# Patient Record
Sex: Male | Born: 1960 | Race: White | Hispanic: No | Marital: Married | Smoking: Former smoker
Health system: Southern US, Community
[De-identification: ages and names within clinical notes are randomized; demographics above are authoritative.]

## PROBLEM LIST (undated history)

## (undated) DIAGNOSIS — J189 Pneumonia, unspecified organism: Secondary | ICD-10-CM

## (undated) DIAGNOSIS — T7840XA Allergy, unspecified, initial encounter: Secondary | ICD-10-CM

## (undated) DIAGNOSIS — I10 Essential (primary) hypertension: Secondary | ICD-10-CM

## (undated) HISTORY — DX: Allergy, unspecified, initial encounter: T78.40XA

## (undated) HISTORY — DX: Essential (primary) hypertension: I10

## (undated) HISTORY — PX: OTHER SURGICAL HISTORY: SHX169

## (undated) HISTORY — DX: Pneumonia, unspecified organism: J18.9

---

## 1997-10-13 ENCOUNTER — Ambulatory Visit (HOSPITAL_COMMUNITY): Admission: RE | Admit: 1997-10-13 | Discharge: 1997-10-13 | Payer: Self-pay | Admitting: Allergy and Immunology

## 1999-06-01 ENCOUNTER — Encounter: Payer: Self-pay | Admitting: Emergency Medicine

## 1999-06-01 ENCOUNTER — Emergency Department (HOSPITAL_COMMUNITY): Admission: EM | Admit: 1999-06-01 | Discharge: 1999-06-01 | Payer: Self-pay | Admitting: Emergency Medicine

## 2003-12-12 ENCOUNTER — Emergency Department (HOSPITAL_COMMUNITY): Admission: EM | Admit: 2003-12-12 | Discharge: 2003-12-12 | Payer: Self-pay | Admitting: Emergency Medicine

## 2004-05-28 ENCOUNTER — Ambulatory Visit: Payer: Self-pay | Admitting: Family Medicine

## 2004-06-19 ENCOUNTER — Ambulatory Visit: Payer: Self-pay | Admitting: Family Medicine

## 2004-07-11 ENCOUNTER — Ambulatory Visit: Payer: Self-pay | Admitting: Family Medicine

## 2005-12-13 ENCOUNTER — Ambulatory Visit: Payer: Self-pay | Admitting: Family Medicine

## 2005-12-23 ENCOUNTER — Ambulatory Visit: Payer: Self-pay | Admitting: Family Medicine

## 2006-01-06 ENCOUNTER — Ambulatory Visit: Payer: Self-pay | Admitting: Family Medicine

## 2006-07-07 ENCOUNTER — Ambulatory Visit: Payer: Self-pay | Admitting: Family Medicine

## 2006-12-11 DIAGNOSIS — J45909 Unspecified asthma, uncomplicated: Secondary | ICD-10-CM | POA: Insufficient documentation

## 2006-12-11 DIAGNOSIS — I10 Essential (primary) hypertension: Secondary | ICD-10-CM | POA: Insufficient documentation

## 2007-01-16 ENCOUNTER — Ambulatory Visit: Payer: Self-pay | Admitting: Family Medicine

## 2007-01-16 DIAGNOSIS — J309 Allergic rhinitis, unspecified: Secondary | ICD-10-CM | POA: Insufficient documentation

## 2007-05-14 ENCOUNTER — Emergency Department (HOSPITAL_COMMUNITY): Admission: EM | Admit: 2007-05-14 | Discharge: 2007-05-14 | Payer: Self-pay | Admitting: Emergency Medicine

## 2007-06-09 ENCOUNTER — Ambulatory Visit: Payer: Self-pay | Admitting: Family Medicine

## 2007-06-09 DIAGNOSIS — J209 Acute bronchitis, unspecified: Secondary | ICD-10-CM | POA: Insufficient documentation

## 2007-09-18 ENCOUNTER — Emergency Department (HOSPITAL_BASED_OUTPATIENT_CLINIC_OR_DEPARTMENT_OTHER): Admission: EM | Admit: 2007-09-18 | Discharge: 2007-09-19 | Payer: Self-pay | Admitting: Emergency Medicine

## 2007-12-03 ENCOUNTER — Ambulatory Visit: Payer: Self-pay | Admitting: Family Medicine

## 2007-12-05 LAB — CONVERTED CEMR LAB
ALT: 28 units/L (ref 0–53)
AST: 23 units/L (ref 0–37)
Basophils Absolute: 0 10*3/uL (ref 0.0–0.1)
Basophils Relative: 0.3 % (ref 0.0–3.0)
CO2: 26 meq/L (ref 19–32)
Calcium: 8.9 mg/dL (ref 8.4–10.5)
Chloride: 106 meq/L (ref 96–112)
Creatinine, Ser: 1 mg/dL (ref 0.4–1.5)
Direct LDL: 108.3 mg/dL
Eosinophils Relative: 3.8 % (ref 0.0–5.0)
Glucose, Bld: 72 mg/dL (ref 70–99)
HDL: 30.2 mg/dL — ABNORMAL LOW (ref >39.0)
Hemoglobin: 13.7 g/dL (ref 13.0–17.0)
Lymphocytes Relative: 17.9 % (ref 12.0–46.0)
Monocytes Relative: 7.3 % (ref 3.0–12.0)
Neutro Abs: 5.9 10*3/uL (ref 1.4–7.7)
Neutrophils Relative %: 70.7 % (ref 43.0–77.0)
RBC: 4.4 M/uL (ref 4.22–5.81)
TSH: 1.07 microintl units/mL (ref 0.35–5.50)
Total Bilirubin: 0.7 mg/dL (ref 0.3–1.2)
Total CHOL/HDL Ratio: 6.8
Total Protein: 7 g/dL (ref 6.0–8.3)
VLDL: 24 mg/dL (ref 0–40)
WBC: 8.3 10*3/uL (ref 4.5–10.5)

## 2008-02-19 ENCOUNTER — Ambulatory Visit: Payer: Self-pay | Admitting: Family Medicine

## 2008-04-05 ENCOUNTER — Encounter: Payer: Self-pay | Admitting: Family Medicine

## 2008-04-06 ENCOUNTER — Encounter: Payer: Self-pay | Admitting: Family Medicine

## 2008-04-28 ENCOUNTER — Telehealth: Payer: Self-pay | Admitting: Family Medicine

## 2008-05-30 ENCOUNTER — Ambulatory Visit: Payer: Self-pay | Admitting: Family Medicine

## 2008-07-01 ENCOUNTER — Encounter: Payer: Self-pay | Admitting: Emergency Medicine

## 2008-07-01 ENCOUNTER — Ambulatory Visit: Payer: Self-pay | Admitting: Critical Care Medicine

## 2008-07-01 ENCOUNTER — Ambulatory Visit: Payer: Self-pay | Admitting: Diagnostic Radiology

## 2008-07-01 ENCOUNTER — Inpatient Hospital Stay (HOSPITAL_COMMUNITY): Admission: EM | Admit: 2008-07-01 | Discharge: 2008-07-07 | Payer: Self-pay | Admitting: Internal Medicine

## 2008-07-01 ENCOUNTER — Ambulatory Visit: Payer: Self-pay | Admitting: Internal Medicine

## 2008-07-11 ENCOUNTER — Ambulatory Visit: Payer: Self-pay | Admitting: Family Medicine

## 2008-07-11 DIAGNOSIS — J189 Pneumonia, unspecified organism: Secondary | ICD-10-CM | POA: Insufficient documentation

## 2009-01-30 ENCOUNTER — Ambulatory Visit: Payer: Self-pay | Admitting: Family Medicine

## 2009-01-31 LAB — CONVERTED CEMR LAB
ALT: 26 units/L (ref 0–53)
AST: 26 units/L (ref 0–37)
Albumin: 3.8 g/dL (ref 3.5–5.2)
Alkaline Phosphatase: 64 units/L (ref 39–117)
Basophils Relative: 1.2 % (ref 0.0–3.0)
CO2: 28 meq/L (ref 19–32)
Calcium: 8.6 mg/dL (ref 8.4–10.5)
GFR calc non Af Amer: 75.94 mL/min (ref >60)
HCT: 42 % (ref 39.0–52.0)
HDL: 32.6 mg/dL — ABNORMAL LOW (ref >39.00)
Hemoglobin: 13.9 g/dL (ref 13.0–17.0)
Lymphocytes Relative: 20.1 % (ref 12.0–46.0)
Lymphs Abs: 1.3 10*3/uL (ref 0.7–4.0)
Monocytes Relative: 9 % (ref 3.0–12.0)
Neutro Abs: 4.1 10*3/uL (ref 1.4–7.7)
Potassium: 4.4 meq/L (ref 3.5–5.1)
RBC: 4.59 M/uL (ref 4.22–5.81)
Sodium: 143 meq/L (ref 135–145)
TSH: 1.79 microintl units/mL (ref 0.35–5.50)
Total CHOL/HDL Ratio: 5
Total Protein: 6.7 g/dL (ref 6.0–8.3)
VLDL: 30.2 mg/dL (ref 0.0–40.0)

## 2009-05-03 ENCOUNTER — Ambulatory Visit: Payer: Self-pay | Admitting: Family Medicine

## 2009-06-01 ENCOUNTER — Ambulatory Visit: Payer: Self-pay | Admitting: Family Medicine

## 2009-06-30 ENCOUNTER — Ambulatory Visit: Payer: Self-pay | Admitting: Family Medicine

## 2009-08-31 ENCOUNTER — Ambulatory Visit: Payer: Self-pay | Admitting: Cardiology

## 2009-08-31 ENCOUNTER — Ambulatory Visit: Payer: Self-pay | Admitting: Diagnostic Radiology

## 2009-08-31 ENCOUNTER — Observation Stay (HOSPITAL_COMMUNITY): Admission: EM | Admit: 2009-08-31 | Discharge: 2009-09-01 | Payer: Self-pay | Admitting: Cardiology

## 2009-08-31 ENCOUNTER — Encounter: Payer: Self-pay | Admitting: Emergency Medicine

## 2009-09-04 ENCOUNTER — Telehealth (INDEPENDENT_AMBULATORY_CARE_PROVIDER_SITE_OTHER): Payer: Self-pay | Admitting: *Deleted

## 2009-09-27 ENCOUNTER — Encounter: Payer: Self-pay | Admitting: Cardiology

## 2009-09-27 DIAGNOSIS — R079 Chest pain, unspecified: Secondary | ICD-10-CM | POA: Insufficient documentation

## 2009-09-28 ENCOUNTER — Encounter: Payer: Self-pay | Admitting: Internal Medicine

## 2009-09-28 ENCOUNTER — Ambulatory Visit: Payer: Self-pay | Admitting: Cardiology

## 2009-09-28 LAB — CONVERTED CEMR LAB
BUN: 18 mg/dL (ref 6–23)
Calcium: 9.5 mg/dL (ref 8.4–10.5)
Chloride: 109 meq/L (ref 96–112)
Creatinine, Ser: 1.2 mg/dL (ref 0.4–1.5)
Eosinophils Relative: 5 % (ref 0.0–5.0)
GFR calc non Af Amer: 69.83 mL/min (ref >60)
INR: 1.2 — ABNORMAL HIGH (ref 0.8–1.0)
Lymphocytes Relative: 15.8 % (ref 12.0–46.0)
MCV: 92.5 fL (ref 78.0–100.0)
Monocytes Absolute: 0.6 10*3/uL (ref 0.1–1.0)
Neutrophils Relative %: 73.2 % (ref 43.0–77.0)
Platelets: 365 10*3/uL (ref 150.0–400.0)
Prothrombin Time: 13.1 s — ABNORMAL HIGH (ref 9.7–11.8)
WBC: 9.9 10*3/uL (ref 4.5–10.5)

## 2009-10-06 ENCOUNTER — Inpatient Hospital Stay (HOSPITAL_BASED_OUTPATIENT_CLINIC_OR_DEPARTMENT_OTHER): Admission: RE | Admit: 2009-10-06 | Discharge: 2009-10-06 | Payer: Self-pay | Admitting: Internal Medicine

## 2009-10-06 ENCOUNTER — Ambulatory Visit: Payer: Self-pay | Admitting: Internal Medicine

## 2009-10-09 ENCOUNTER — Telehealth: Payer: Self-pay | Admitting: Family Medicine

## 2009-10-26 ENCOUNTER — Ambulatory Visit: Payer: Self-pay | Admitting: Cardiology

## 2009-10-26 ENCOUNTER — Ambulatory Visit: Payer: Self-pay | Admitting: Family Medicine

## 2009-10-26 DIAGNOSIS — K219 Gastro-esophageal reflux disease without esophagitis: Secondary | ICD-10-CM | POA: Insufficient documentation

## 2009-10-26 DIAGNOSIS — I2510 Atherosclerotic heart disease of native coronary artery without angina pectoris: Secondary | ICD-10-CM | POA: Insufficient documentation

## 2010-05-10 ENCOUNTER — Encounter: Payer: Self-pay | Admitting: Family Medicine

## 2010-05-15 ENCOUNTER — Other Ambulatory Visit: Payer: Self-pay | Admitting: Family Medicine

## 2010-05-15 ENCOUNTER — Ambulatory Visit
Admission: RE | Admit: 2010-05-15 | Discharge: 2010-05-15 | Payer: Self-pay | Source: Home / Self Care | Attending: Cardiology | Admitting: Cardiology

## 2010-05-15 ENCOUNTER — Ambulatory Visit
Admission: RE | Admit: 2010-05-15 | Discharge: 2010-05-15 | Payer: Self-pay | Source: Home / Self Care | Attending: Family Medicine | Admitting: Family Medicine

## 2010-05-15 LAB — TSH: TSH: 2.62 u[IU]/mL (ref 0.35–5.50)

## 2010-05-15 LAB — BASIC METABOLIC PANEL
CO2: 28 mEq/L (ref 19–32)
Calcium: 9.1 mg/dL (ref 8.4–10.5)
Glucose, Bld: 85 mg/dL (ref 70–99)
Potassium: 4.9 mEq/L (ref 3.5–5.1)
Sodium: 143 mEq/L (ref 135–145)

## 2010-05-15 LAB — CONVERTED CEMR LAB
Bilirubin Urine: NEGATIVE
Ketones, urine, test strip: NEGATIVE
Specific Gravity, Urine: 1.015

## 2010-05-15 LAB — CBC WITH DIFFERENTIAL/PLATELET
Basophils Absolute: 0.1 10*3/uL (ref 0.0–0.1)
Eosinophils Absolute: 0.6 10*3/uL (ref 0.0–0.7)
HCT: 40.3 % (ref 39.0–52.0)
Hemoglobin: 13.7 g/dL (ref 13.0–17.0)
Lymphs Abs: 1.9 10*3/uL (ref 0.7–4.0)
MCHC: 34 g/dL (ref 30.0–36.0)
Neutro Abs: 4.1 10*3/uL (ref 1.4–7.7)
RDW: 13.4 % (ref 11.5–14.6)

## 2010-05-15 LAB — LIPID PANEL
Cholesterol: 126 mg/dL (ref 0–200)
HDL: 35.2 mg/dL — ABNORMAL LOW (ref >39.00)

## 2010-05-15 LAB — HEPATIC FUNCTION PANEL: Albumin: 3.7 g/dL (ref 3.5–5.2)

## 2010-05-15 NOTE — Assessment & Plan Note (Signed)
Summary: cough/head and chest congestion/cjr   Vital Signs:  Patient profile:   50 year old male Weight:      217 pounds Temp:     98.4 degrees F oral Pulse rate:   89 / minute BP sitting:   116 / 64  (left arm) Cuff size:   large  Vitals Entered By: Alfred Levins, CMA (May 03, 2009 4:31 PM) CC: head and chest congestion x1 mth off and on   History of Present Illness: Here for one week of stuffy head, PND, ST, chest congestion, and coughing up yellow sputum. No fever. On his usual inhalers.   Allergies: 1)  ! Penicillin V Potassium (Penicillin V Potassium) 2)  ! * Eggs  Past History:  Past Medical History: Reviewed history from 07/11/2008 and no changes required. Asthma, sees Dr. Corinda Gubler Hypertension Allergic rhinitis normal treadmill ECHO on 04-06-08 at [facility], admitted for chest pains pneumonia 3-10  Past Surgical History: Reviewed history from 01/16/2007 and no changes required. Denies surgical history  Review of Systems  The patient denies anorexia, fever, weight loss, weight gain, vision loss, decreased hearing, hoarseness, chest pain, syncope, dyspnea on exertion, peripheral edema, hemoptysis, abdominal pain, melena, hematochezia, severe indigestion/heartburn, hematuria, incontinence, genital sores, muscle weakness, suspicious skin lesions, transient blindness, difficulty walking, depression, unusual weight change, abnormal bleeding, enlarged lymph nodes, angioedema, breast masses, and testicular masses.    Physical Exam  General:  Well-developed,well-nourished,in no acute distress; alert,appropriate and cooperative throughout examination Head:  Normocephalic and atraumatic without obvious abnormalities. No apparent alopecia or balding. Eyes:  No corneal or conjunctival inflammation noted. EOMI. Perrla. Funduscopic exam benign, without hemorrhages, exudates or papilledema. Vision grossly normal. Ears:  External ear exam shows no significant lesions or  deformities.  Otoscopic examination reveals clear canals, tympanic membranes are intact bilaterally without bulging, retraction, inflammation or discharge. Hearing is grossly normal bilaterally. Nose:  External nasal examination shows no deformity or inflammation. Nasal mucosa are pink and moist without lesions or exudates. Mouth:  Oral mucosa and oropharynx without lesions or exudates.  Teeth in good repair. Neck:  No deformities, masses, or tenderness noted. Lungs:  scattered rhonchi and wheezes, no rales   Impression & Recommendations:  Problem # 1:  ACUTE BRONCHITIS (ICD-466.0)  His updated medication list for this problem includes:    Symbicort 160-4.5 Mcg/act Aero (Budesonide-formoterol fumarate) .Marland Kitchen... 1 puff two times a day    Proair Hfa 108 (90 Base) Mcg/act Aers (Albuterol sulfate) .Marland Kitchen... As needed    Singulair 10 Mg Tabs (Montelukast sodium) .Marland Kitchen... 1 by mouth once daily    Spiriva Handihaler 18 Mcg Caps (Tiotropium bromide monohydrate) ..... Once daily    Levaquin 500 Mg Tabs (Levofloxacin) ..... Once daily  Complete Medication List: 1)  Symbicort 160-4.5 Mcg/act Aero (Budesonide-formoterol fumarate) .Marland Kitchen.. 1 puff two times a day 2)  Allegra 180 Mg Tabs (Fexofenadine hcl) .Marland Kitchen.. 1 by mouth once daily 3)  Nasonex 50 Mcg/act Susp (Mometasone furoate) .... 2 sprays each nostril once daily 4)  Proair Hfa 108 (90 Base) Mcg/act Aers (Albuterol sulfate) .... As needed 5)  Singulair 10 Mg Tabs (Montelukast sodium) .Marland Kitchen.. 1 by mouth once daily 6)  Furosemide 20 Mg Tabs (Furosemide) .... Once daily 7)  Astepro 137 Mcg/spray Soln (Azelastine hcl) .Marland Kitchen.. 1 spray each nostril once daily 8)  Spiriva Handihaler 18 Mcg Caps (Tiotropium bromide monohydrate) .... Once daily 9)  Amlodipine Besy-benazepril Hcl 10-20 Mg Caps (Amlodipine besy-benazepril hcl) .Marland Kitchen.. 1 by mouth once daily 10)  Levaquin  500 Mg Tabs (Levofloxacin) .... Once daily  Patient Instructions: 1)  Please schedule a follow-up appointment  as needed .  Prescriptions: LEVAQUIN 500 MG TABS (LEVOFLOXACIN) once daily  #10 x 0   Entered and Authorized by:   Nelwyn Salisbury MD   Signed by:   Nelwyn Salisbury MD on 05/03/2009   Method used:   Electronically to        [facility]  [phone]* (retail)       [address]       [place], [place]  [address]       Ph: [phone] or [phone]       Fax: [phone]   RxID:   [id number]

## 2010-05-15 NOTE — Assessment & Plan Note (Signed)
Summary: ? flu//ccm   Vital Signs:  Patient profile:   50 year old male Weight:      212 pounds Temp:     98.0 degrees F oral BP sitting:   114 / 80  (left arm) Cuff size:   regular  Vitals Entered By: Raechel Ache, RN (June 30, 2009 2:06 PM) CC: Sick since Wed with nose congestion, cough, body aches and headache.   History of Present Illness: Here for 3 days of aches, HA, PND, ST, chest congesiton, and coughing up yellow sputum. No fever.   Allergies: 1)  ! Penicillin V Potassium (Penicillin V Potassium) 2)  ! * Eggs  Past History:  Past Medical History: Reviewed history from 07/11/2008 and no changes required. Asthma, sees Dr. Corinda Gubler Hypertension Allergic rhinitis normal treadmill ECHO on 04-06-08 at [facility], admitted for chest pains pneumonia 3-10  Review of Systems  The patient denies anorexia, fever, weight loss, weight gain, vision loss, decreased hearing, hoarseness, chest pain, syncope, peripheral edema, headaches, hemoptysis, abdominal pain, melena, hematochezia, severe indigestion/heartburn, hematuria, incontinence, genital sores, muscle weakness, suspicious skin lesions, transient blindness, difficulty walking, depression, unusual weight change, abnormal bleeding, enlarged lymph nodes, angioedema, breast masses, and testicular masses.    Physical Exam  General:  Well-developed,well-nourished,in no acute distress; alert,appropriate and cooperative throughout examination Head:  Normocephalic and atraumatic without obvious abnormalities. No apparent alopecia or balding. Eyes:  No corneal or conjunctival inflammation noted. EOMI. Perrla. Funduscopic exam benign, without hemorrhages, exudates or papilledema. Vision grossly normal. Ears:  External ear exam shows no significant lesions or deformities.  Otoscopic examination reveals clear canals, tympanic membranes are intact bilaterally without bulging, retraction, inflammation or discharge. Hearing is  grossly normal bilaterally. Nose:  External nasal examination shows no deformity or inflammation. Nasal mucosa are pink and moist without lesions or exudates. Mouth:  Oral mucosa and oropharynx without lesions or exudates.  Teeth in good repair. Neck:  No deformities, masses, or tenderness noted. Lungs:  scattered rhonchi and wheezes   Impression & Recommendations:  Problem # 1:  ACUTE BRONCHITIS (ICD-466.0)  His updated medication list for this problem includes:    Symbicort 160-4.5 Mcg/act Aero (Budesonide-formoterol fumarate) .Marland Kitchen... 1 puff two times a day    Proair Hfa 108 (90 Base) Mcg/act Aers (Albuterol sulfate) .Marland Kitchen... As needed    Singulair 10 Mg Tabs (Montelukast sodium) .Marland Kitchen... 1 by mouth once daily    Spiriva Handihaler 18 Mcg Caps (Tiotropium bromide monohydrate) ..... Once daily    Levaquin 500 Mg Tabs (Levofloxacin) ..... Once daily  Problem # 2:  ASTHMA (ICD-493.90)  His updated medication list for this problem includes:    Symbicort 160-4.5 Mcg/act Aero (Budesonide-formoterol fumarate) .Marland Kitchen... 1 puff two times a day    Proair Hfa 108 (90 Base) Mcg/act Aers (Albuterol sulfate) .Marland Kitchen... As needed    Singulair 10 Mg Tabs (Montelukast sodium) .Marland Kitchen... 1 by mouth once daily    Spiriva Handihaler 18 Mcg Caps (Tiotropium bromide monohydrate) ..... Once daily  Complete Medication List: 1)  Symbicort 160-4.5 Mcg/act Aero (Budesonide-formoterol fumarate) .Marland Kitchen.. 1 puff two times a day 2)  Allegra 180 Mg Tabs (Fexofenadine hcl) .Marland Kitchen.. 1 by mouth once daily 3)  Nasonex 50 Mcg/act Susp (Mometasone furoate) .... 2 sprays each nostril once daily 4)  Proair Hfa 108 (90 Base) Mcg/act Aers (Albuterol sulfate) .... As needed 5)  Singulair 10 Mg Tabs (Montelukast sodium) .Marland Kitchen.. 1 by mouth once daily 6)  Furosemide 20 Mg Tabs (Furosemide) .... Once daily 7)  Astepro 137 Mcg/spray Soln (Azelastine hcl) .Marland Kitchen.. 1 spray each nostril once daily 8)  Spiriva Handihaler 18 Mcg Caps (Tiotropium bromide monohydrate)  .... Once daily 9)  Amlodipine Besy-benazepril Hcl 10-20 Mg Caps (Amlodipine besy-benazepril hcl) .Marland Kitchen.. 1 by mouth once daily 10)  Levaquin 500 Mg Tabs (Levofloxacin) .... Once daily  Patient Instructions: 1)  Please schedule a follow-up appointment as needed .  Prescriptions: LEVAQUIN 500 MG TABS (LEVOFLOXACIN) once daily  #10 x 0   Entered and Authorized by:   Nelwyn Salisbury MD   Signed by:   Nelwyn Salisbury MD on 06/30/2009   Method used:   Electronically to        [facility]  [phone]* (retail)       [address]       [place], [place]  [address]       Ph: [phone] or [phone]       Fax: [phone]   RxID:   [id number]

## 2010-05-15 NOTE — Progress Notes (Signed)
  Recieved papers from [facility] asking for Clearance on this Pt. I have sent paperwork to [facility] Nurse. Cala Bradford Mesiemore  Sep 04, 2009 2:59 PM

## 2010-05-15 NOTE — Miscellaneous (Signed)
  Clinical Lists Changes  Problems: Added new problem of CAD (ICD-414.00) Observations: Added new observation of PAST MED HX: Asthma, sees Dr. Corinda Gubler Hypertension Allergic rhinitis Chest pain    Stress ECHO  03/2008  [place] Kayser [place]  /  [facility]..08/31/2009....MI ruled out  /  recurrent chest pain office September 28, 2009 /  catheterization... October 06, 2009... mild to moderate nonobstructive disease.... current pain is not cardiac.... normal LV function.... plan aspirin and a statin CAD.... mild.... nonobstructive.... catheterization.... June, 2011 pneumonia 3-10 Family Hx CAD  (10/26/2009 12:29) Added new observation of REFERRING MD: Delano Metz, Allergy (10/26/2009 12:29) Added new observation of PRIMARY MD: Gershon Crane, MD (10/26/2009 12:29)       Past History:  Past Medical History: Asthma, sees Dr. Corinda Gubler Hypertension Allergic rhinitis Chest pain    Stress ECHO  03/2008  Roma Kayser [place]  /  [facility]..08/31/2009....MI ruled out  /  recurrent chest pain office September 28, 2009 /  catheterization... October 06, 2009... mild to moderate nonobstructive disease.... current pain is not cardiac.... normal LV function.... plan aspirin and a statin CAD.... mild.... nonobstructive.... catheterization.... June, 2011 pneumonia 3-10 Family Hx CAD

## 2010-05-15 NOTE — Miscellaneous (Signed)
  Clinical Lists Changes  Problems: Added new problem of CHEST PAIN (ICD-786.50) Observations: Added new observation of PRIMARY MD: Gershon Crane, MD (09/27/2009 8:24) Added new observation of REFERRING MD: Delano Metz, Allergy (09/27/2009 8:24) Added new observation of PAST MED HX: Asthma, sees Dr. Corinda Gubler Hypertension Allergic rhinitis Chest pain    Stress ECHO  03/2008  Roma Kayser [place]  /  [facility]..08/31/2009..probable musculoskeletal pneumonia 3-10 Family Hx CAD  (09/27/2009 8:24)       Past History:  Past Medical History: Asthma, sees Dr. Corinda Gubler Hypertension Allergic rhinitis Chest pain    Stress ECHO  03/2008  Roma Kayser [place]  /  [facility]..08/31/2009..probable musculoskeletal pneumonia 3-10 Family Hx CAD

## 2010-05-15 NOTE — Progress Notes (Signed)
Summary: cath & CP fu  Phone Note Call from Patient   Summary of Call: FU appt made Dr. Clent Ridges 10-26-09 heart cath 30% blockage & started on Crestor.  Said pain due to something else, maybe reflux.  Cannot take off work to make sooner appt, has been out so much already.  Will try Prilosec OTC and call back as needed.   Initial call taken by: Rudy Jew, RN,  October 09, 2009 9:38 AM  Follow-up for Phone Call        noted Follow-up by: Nelwyn Salisbury MD,  October 09, 2009 12:46 PM

## 2010-05-15 NOTE — Assessment & Plan Note (Signed)
Summary: FU HEART CATH & CP OFF & ON/PS   Vital Signs:  Patient profile:   50 year old male Weight:      202 pounds Pulse rate:   72 / minute Pulse rhythm:   regular BP sitting:   112 / 74  (left arm) Cuff size:   regular  Vitals Entered By: Raechel Ache, RN (October 26, 2009 8:24 AM) CC: F/u heart cath from 2 weeks ago- cath ok and still having chest pains.   History of Present Illness: Here to discuss continued chest pains. He had a cardiac cath recently showing only insignificant lesions, so his pains are now felt to be non-cardiac. He was started on aspirin and a statin, which is certainly wise. he still describes epigastric squeezing pains that come and go at random, usually lasting only a few seconds. he does describe heartburn at times, and sometimes food stops halfway down when he swallows. These pains are not related to exertion. For the past few weeks he has been taking an OTC Prilosec each morning, and in fact the pains have improved a bit. he had been geting some mild nasuea with the pains before, but not now. No SOB. His BMs are normal.  Allergies: 1)  ! Penicillin V Potassium (Penicillin V Potassium) 2)  ! * Eggs  Past History:  Past Medical History: Reviewed history from 09/28/2009 and no changes required. Asthma, sees Dr. Corinda Gubler Hypertension Allergic rhinitis Chest pain    Stress ECHO  03/2008  Roma Kayser [place]  /  [facility] [place] [facility]..08/31/2009....MI ruled out  /  recurrent chest pain office September 28, 2009 pneumonia 3-10 Family Hx CAD  Past Surgical History: Denies surgical history cardiac cath 10-06-09 per Dr. Myrtis Ser, showed mild nonobstructive disease   Review of Systems  The patient denies anorexia, fever, weight loss, weight gain, vision loss, decreased hearing, hoarseness, syncope, dyspnea on exertion, peripheral edema, prolonged cough, headaches, hemoptysis, abdominal pain, melena, hematochezia, severe indigestion/heartburn, hematuria, incontinence, genital sores, muscle  weakness, suspicious skin lesions, transient blindness, difficulty walking, depression, unusual weight change, abnormal bleeding, enlarged lymph nodes, angioedema, breast masses, and testicular masses.    Physical Exam  General:  Well-developed,well-nourished,in no acute distress; alert,appropriate and cooperative throughout examination Neck:  No deformities, masses, or tenderness noted. Chest Wall:  No deformities, masses, tenderness or gynecomastia noted. Lungs:  Normal respiratory effort, chest expands symmetrically. Lungs are clear to auscultation, no crackles or wheezes. Heart:  Normal rate and regular rhythm. S1 and S2 normal without gallop, murmur, click, rub or other extra sounds. Abdomen:  Bowel sounds positive,abdomen soft and non-tender without masses, organomegaly or hernias noted.   Impression & Recommendations:  Problem # 1:  CHEST PAIN (ICD-786.50)  Problem # 2:  GERD (ICD-530.81)  His updated medication list for this problem includes:    Omeprazole 40 Mg Cpdr (Omeprazole) ..... Once daily  Complete Medication List: 1)  Symbicort 160-4.5 Mcg/act Aero (Budesonide-formoterol fumarate) .Marland Kitchen.. 1 puff two times a day 2)  Allegra 180 Mg Tabs (Fexofenadine hcl) .Marland Kitchen.. 1 by mouth once daily 3)  Nasonex 50 Mcg/act Susp (Mometasone furoate) .... 2 sprays each nostril once daily 4)  Proair Hfa 108 (90 Base) Mcg/act Aers (Albuterol sulfate) .... As needed 5)  Singulair 10 Mg Tabs (Montelukast sodium) .Marland Kitchen.. 1 by mouth once daily 6)  Furosemide 20 Mg Tabs (Furosemide) .... Once daily 7)  Astepro 137 Mcg/spray Soln (Azelastine hcl) .Marland Kitchen.. 1 spray each nostril once daily 8)  Spiriva Handihaler 18 Mcg Caps (Tiotropium bromide  monohydrate) .... Once daily 9)  Amlodipine Besy-benazepril Hcl 10-20 Mg Caps (Amlodipine besy-benazepril hcl) .Marland Kitchen.. 1 by mouth once daily 10)  Crestor 10 Mg Tabs (Rosuvastatin calcium) .... Take one tablet by mouth daily. 11)  Aspirin 81 Mg Tbec (Aspirin) .... Once  daily 12)  Omeprazole 40 Mg Cpdr (Omeprazole) .... Once daily  Patient Instructions: 1)  This is quite consistent with GERD and an element of esophageal spasm. Try Omeprazole 40 mg every am.  2)  Please schedule a follow-up appointment as needed .  Prescriptions: OMEPRAZOLE 40 MG CPDR (OMEPRAZOLE) once daily  #30 x 11   Entered and Authorized by:   Nelwyn Salisbury MD   Signed by:   Nelwyn Salisbury MD on 10/26/2009   Method used:   Electronically to        [facility]  [phone]* (retail)       [address]       [place], [place]  [address]       Ph: [phone] or [phone]       Fax: [phone]   RxID:   [id number]

## 2010-05-15 NOTE — Assessment & Plan Note (Signed)
Summary: 1 month rov.sl   Visit Type:  Follow-up Referring Provider:  Delano Metz, Allergy Primary Provider:  Gershon Crane, MD  CC:  chest pain.  History of Present Illness: The patient is seen for followup of chest pain.  I saw him last in the office September 28 2009.  Because of recurrent chest pain decision was made to proceed with catheterization.  This was done on October 06, 2009 from the right radial artery.  He did very well.  He has mild disease that is nonobstructive.  It was felt that aspirin should be continued and Crestor started.  Since that time he has also been seen by primary care.  There is question of some of his symptoms are from with reflux..  Today he is stable.  Current Medications (verified): 1)  Symbicort 160-4.5 Mcg/act Aero (Budesonide-Formoterol Fumarate) .Marland Kitchen.. 1 Puff Two Times A Day 2)  Allegra 180 Mg Tabs (Fexofenadine Hcl) .Marland Kitchen.. 1 By Mouth Once Daily 3)  Nasonex 50 Mcg/act Susp (Mometasone Furoate) .... 2 Sprays Each Nostril Once Daily 4)  Proair Hfa 108 (90 Base) Mcg/act Aers (Albuterol Sulfate) .... As Needed 5)  Singulair 10 Mg Tabs (Montelukast Sodium) .Marland Kitchen.. 1 By Mouth Once Daily 6)  Furosemide 20 Mg  Tabs (Furosemide) .... Once Daily 7)  Astepro 137 Mcg/spray Soln (Azelastine Hcl) .Marland Kitchen.. 1 Spray Each Nostril Once Daily 8)  Spiriva Handihaler 18 Mcg Caps (Tiotropium Bromide Monohydrate) .... Once Daily 9)  Amlodipine Besy-Benazepril Hcl 10-20 Mg Caps (Amlodipine Besy-Benazepril Hcl) .Marland Kitchen.. 1 By Mouth Once Daily 10)  Crestor 10 Mg Tabs (Rosuvastatin Calcium) .... Take One Tablet By Mouth Daily. 11)  Aspirin 81 Mg Tbec (Aspirin) .... Once Daily 12)  Omeprazole 40 Mg Cpdr (Omeprazole) .... Once Daily  Allergies (verified): 1)  ! Penicillin V Potassium (Penicillin V Potassium) 2)  ! * Eggs  Past History:  Past Medical History: Asthma, sees Dr. Corinda Gubler Hypertension Allergic rhinitis Chest pain    Stress ECHO  03/2008  [place]oma Kayser [place]  /  [facility]..08/31/2009....MI ruled out  /   recurrent chest pain office September 28, 2009 /  catheterization... October 06, 2009... mild to moderate nonobstructive disease.... current pain is not cardiac.... normal LV function.... plan aspirin and a statin CAD.... mild.... nonobstructive.... catheterization.... June, 2011 pneumonia 3-10 Family Hx CAD  Review of Systems       Patient denies fever, chills, headache, sweats, rash, change in vision, change in hearing, chest pain, cough, nausea vomiting, urinary symptoms.  All other systems are reviewed and are negative.  Vital Signs:  Patient profile:   50 year old male Height:      66 inches Weight:      205 pounds BMI:     33.21 Pulse rate:   82 / minute Resp:     16 per minute BP sitting:   116 / 72  (left arm)  Vitals Entered By: Marrion Coy, CNA (October 26, 2009 3:27 PM)  Physical Exam  General:  he is stable today. Eyes:  no xanthelasma. Neck:  no jugular venous distention. Lungs:  lungs are clear.  Respiratory effort is nonlabored. Heart:  cardiac exam reveals S1-S2.  No clicks or significant murmurs. Abdomen:  abdomen is protuberant. Msk:  no musculoskeletal deformities. Extremities:  right radial artery is completely healed. Skin:  no skin rashes. Psych:  patient is oriented to person time and place.  Affect is normal.   Impression & Recommendations:  Problem # 1:  CAD (ICD-414.00)  Catheterization has revealed  mild nonobstructive disease.  Aggressive secondary prevention is in order.  The patient is to have blood pressure control and he will remain on aspirin.  Crestor has been started.  A weight loss program would be very helpful.  He and I discussed this at length.  All seem back in 6 months.EKG is done today and reviewed by me.  He has incomplete right bundle branch block.  Orders: EKG w/ Interpretation (93000)  Problem # 2:  GERD (ICD-530.81)  His updated medication list for this problem includes:    Omeprazole 40 Mg Cpdr (Omeprazole) ..... Once daily I think  it is likely that GERD is playing well.  Meds have been started.  Problem # 3:  HYPERTENSION (ICD-401.9)  His updated medication list for this problem includes:    Furosemide 20 Mg Tabs (Furosemide) ..... Once daily    Amlodipine Besy-benazepril Hcl 10-20 Mg Caps (Amlodipine besy-benazepril hcl) .Marland Kitchen... 1 by mouth once daily    Aspirin 81 Mg Tbec (Aspirin) ..... Once daily I talked to the patient about his salt intake.  He understands that moderation of the vessels approach for him.  Patient Instructions: 1)  Your physician wants you to follow-up in:  6 months.  You will receive a reminder letter in the mail two months in advance. If you don't receive a letter, please call our office to schedule the follow-up appointment.

## 2010-05-15 NOTE — Cardiovascular Report (Signed)
Summary: Pre Cath Orders   Pre Cath Orders   Imported By: Roderic Ovens 10/07/2009 11:14:42  _____________________________________________________________________  External Attachment:    Type:   Image     Comment:   External Document

## 2010-05-15 NOTE — Assessment & Plan Note (Signed)
Summary: eph   Visit Type:  post hospital visit Referring Provider:  Delano Metz, Allergy Primary Provider:  Gershon Crane, MD   History of Present Illness: The patient is seen for the evaluation of chest pain.Andrew Swanson  He was admitted to [facility] in 2009 with chest pain.  He had a stress echo that was normal.  He has a very strong family history of coronary disease.  His father had bypass at a young age.  The patient works regularly.  He was admitted to [facility] Aug 31, 2009.  Cardiac enzymes were negative.  He was allowed to be discharged and to return to work and to followup with [place].  A stress test was not arranged because he had one in 2009.  Today he has continued chest discomfort.  It can come on at rest or with exercise.  It can last for a couple minutes or for 30 minutes.  There is no nausea vomiting or diaphoresis.  It occurs in the center of his chest without radiation.  Current Medications (verified): 1)  Symbicort 160-4.5 Mcg/act Aero (Budesonide-Formoterol Fumarate) .Andrew Swanson.. 1 Puff Two Times A Day 2)  Allegra 180 Mg Tabs (Fexofenadine Hcl) .Andrew Swanson.. 1 By Mouth Once Daily 3)  Nasonex 50 Mcg/act Susp (Mometasone Furoate) .... 2 Sprays Each Nostril Once Daily 4)  Proair Hfa 108 (90 Base) Mcg/act Aers (Albuterol Sulfate) .... As Needed 5)  Singulair 10 Mg Tabs (Montelukast Sodium) .Andrew Swanson.. 1 By Mouth Once Daily 6)  Furosemide 20 Mg  Tabs (Furosemide) .... Once Daily 7)  Astepro 137 Mcg/spray Soln (Azelastine Hcl) .Andrew Swanson.. 1 Spray Each Nostril Once Daily 8)  Spiriva Handihaler 18 Mcg Caps (Tiotropium Bromide Monohydrate) .... Once Daily 9)  Amlodipine Besy-Benazepril Hcl 10-20 Mg Caps (Amlodipine Besy-Benazepril Hcl) .Andrew Swanson.. 1 By Mouth Once Daily  Allergies (verified): 1)  ! Penicillin V Potassium (Penicillin V Potassium) 2)  ! * Eggs  Past History:  Past Medical History: Asthma, sees Dr. Corinda Gubler Hypertension Allergic rhinitis Chest pain    Stress ECHO  03/2008  [place] K[place]er [place]  /   [facility]..08/31/2009....MI ruled out  /  recurrent chest pain office September 28, 2009 pneumonia 3-10 Family Hx CAD  Review of Systems       Patient denies fever, chills, headache, sweats, rash, change in vision, change in hearing,  cough, nausea vomiting, urinary symptoms.  All other systems are reviewed and are negative.  Vital Signs:  Patient profile:   50 year old male Height:      66 inches Weight:      202 pounds BMI:     32.72 Pulse rate:   89 / minute BP sitting:   129 / 76  (left arm) Cuff size:   regular  Vitals Entered By: Burnett Kanaris, CNA (September 28, 2009 3:41 PM)  Physical Exam  General:  patient is stable today. Head:  head is atraumatic. Eyes:  no xanthelasma. Neck:  no jugular venous extension. Chest Wall:  no chest wall tenderness. Lungs:  lungs are clear.  Respiratory effort is nonlabored. Heart:  cardiac exam reveals S1 and S2.  No clicks or significant murmurs. Abdomen:  abdomen is soft. Msk:  no musculoskeletal deformities. Extremities:  no peripheral edema. Skin:  no skin rashes. Psych:  patient is oriented to person time and place.  Affect is normal   Impression & Recommendations:  Problem # 1:  HYPERTENSION (ICD-401.9)  His updated medication list for this problem includes:    Furosemide 20 Mg Tabs (Furosemide) .Andrew KitchenMarland KitchenMarland KitchenMarland Swanson  Once daily    Amlodipine Besy-benazepril Hcl 10-20 Mg Caps (Amlodipine besy-benazepril hcl) .Andrew Swanson... 1 by mouth once daily Blood pressure is controlled now.  No change in therapy.  Problem # 2:  ASTHMA (ICD-493.90)  His updated medication list for this problem includes:    Symbicort 160-4.5 Mcg/act Aero (Budesonide-formoterol fumarate) .Andrew Swanson... 1 puff two times a day    Proair Hfa 108 (90 Base) Mcg/act Aers (Albuterol sulfate) .Andrew Swanson... As needed    Singulair 10 Mg Tabs (Montelukast sodium) .Andrew Swanson... 1 by mouth once daily    Spiriva Handihaler 18 Mcg Caps (Tiotropium bromide monohydrate) ..... Once daily the patient says that is under control.  I do  not believe that this is causing his current problems.  Problem # 3:  CHEST PAIN (ICD-786.50)  His updated medication list for this problem includes:    Amlodipine Besy-benazepril Hcl 10-20 Mg Caps (Amlodipine besy-benazepril hcl) .Andrew Swanson... 1 by mouth once daily The patient continues to have chest pain.  He has a very strong family history.  I am not convinced that exercise testing will help [place] at this point.  Hepatic careful discussion with him and I feel we should proceed with cardiac catheterization.  This will be arranged on an outpatient basis.  EKG is done today. Is reviewed by me..  As part of today's evaluation I have carefully reviewed all of the hospital records that are available from his admission on Aug 31, 2009.  Orders: EKG w/ Interpretation (93000) TLB-BMP (Basic Metabolic Panel-BMET) (80048-METABOL) TLB-CBC Platelet - w/Differential (85025-CBCD) TLB-PT (Protime) (85610-PTP) Cardiac Catheterization (Cardiac Cath)  Patient Instructions: 1)  Labs today 2)  Your physician has requested that you have a cardiac catheterization.  Cardiac catheterization is used to diagnose and/or treat various heart conditions. Doctors may recommend this procedure for a number of different reasons. The most common reason is to evaluate chest pain. Chest pain can be a symptom of coronary artery disease (CAD), and cardiac catheterization can show whether plaque is narrowing or blocking your heart's arteries. This procedure is also used to evaluate the valves, as well as measure the blood flow and oxygen levels in different parts of your heart.  For further information please visit [email or web address]  Please follow instruction sheet, as given. 3)  Follow up in 4 weeks

## 2010-05-15 NOTE — Letter (Signed)
Summary: Cardiac Catheterization Instructions- [facility]  [employer], Main Office  [address]   [place], [place] [address]   Phone: [phone]  Fax: [phone]     09/28/2009 MRN: [id number]  [facility] [address] [place], [place]  [address]  Dear Mr. Pottle,   You are scheduled for a Cardiac Catheterization on Friday 10/06/09 with Dr. Gala Romney  Please arrive to the 1st floor of the Heart and Vascular Center at [facility] at 8:30 am / pm on the day of your procedure. Please do not arrive before 6:30 a.m. Call the Heart and Vascular Center at [phone] if you are unable to make your appointmnet. The Code to get into the parking garage under the building is 0100. Take the elevators to the 1st floor. You must have someone to drive you home. Someone must be with you for the first 24 hours after you arrive home. Please wear clothes that are easy to get on and off and wear slip-on shoes. Do not eat or drink after midnight except water with your medications that morning. Bring all your medications and current insurance cards with you.  _X__ DO NOT take these medications before your procedure: ________Furosemide_____________________________________________________  ___ Make sure you take your aspirin.  ___ You may take ALL of your medications with water that morning. ________________________________________________________________________________________________________________________________  ___ DO NOT take ANY medications before your procedure.  ___ Pre-med instructions:  ________________________________________________________________________________________________________________________________  The usual length of stay after your procedure is 2 to 3 hours. This can vary.  If you have any questions, please call the office at the number listed above.   Meredith Staggers, RN

## 2010-05-15 NOTE — Assessment & Plan Note (Signed)
Summary: FLU-LIKE SXS // RS   Vital Signs:  Patient profile:   50 year old male Temp:     98.8 degrees F oral BP sitting:   110 / 70  (left arm) Cuff size:   large  Vitals Entered By: Sid Falcon LPN (June 01, 2009 4:05 PM) CC: Flu like symptoms   History of Present Illness: 2 day history of chills, mild headache, mild bodyaches, dry cough and rhinorrhea. Sore throat yesterday but none today. Respiratory illness treated in January and fully recovered from that. Ex-smoker. History of asthma well controlled with regular anti-inflammatory medications.  Allergies: 1)  ! Penicillin V Potassium (Penicillin V Potassium) 2)  ! * Eggs  Past History:  Past Medical History: Last updated: 07/11/2008 Asthma, sees Dr. Corinda Gubler Hypertension Allergic rhinitis normal treadmill ECHO on 04-06-08 at [facility], admitted for chest pains pneumonia 3-10 PMH reviewed for relevance  Review of Systems      See HPI  Physical Exam  General:  Well-developed,well-nourished,in no acute distress; alert,appropriate and cooperative throughout examination Ears:  External ear exam shows no significant lesions or deformities.  Otoscopic examination reveals clear canals, tympanic membranes are intact bilaterally without bulging, retraction, inflammation or discharge. Hearing is grossly normal bilaterally. Nose:  External nasal examination shows no deformity or inflammation. Nasal mucosa are pink and moist without lesions or exudates. Mouth:  Oral mucosa and oropharynx without lesions or exudates.  Teeth in good repair. Neck:  No deformities, masses, or tenderness noted. Lungs:  few faint expiratory wheezes but no rales. Symmetric breath sounds. Heart:  Normal rate and regular rhythm. S1 and S2 normal without gallop, murmur, click, rub or other extra sounds.   Impression & Recommendations:  Problem # 1:  ACUTE BRONCHITIS (ICD-466.0) suspect viral origin. Treat symptomatically for now. If  develops any fever or productive cough consider Zithromax His updated medication list for this problem includes:    Symbicort 160-4.5 Mcg/act Aero (Budesonide-formoterol fumarate) .Marland Kitchen... 1 puff two times a day    Proair Hfa 108 (90 Base) Mcg/act Aers (Albuterol sulfate) .Marland Kitchen... As needed    Singulair 10 Mg Tabs (Montelukast sodium) .Marland Kitchen... 1 by mouth once daily    Spiriva Handihaler 18 Mcg Caps (Tiotropium bromide monohydrate) ..... Once daily    Azithromycin 250 Mg Tabs (Azithromycin) .Marland Kitchen... 2 by mouth day 1 then one by mouth once daily for 4 days  Complete Medication List: 1)  Symbicort 160-4.5 Mcg/act Aero (Budesonide-formoterol fumarate) .Marland Kitchen.. 1 puff two times a day 2)  Allegra 180 Mg Tabs (Fexofenadine hcl) .Marland Kitchen.. 1 by mouth once daily 3)  Nasonex 50 Mcg/act Susp (Mometasone furoate) .... 2 sprays each nostril once daily 4)  Proair Hfa 108 (90 Base) Mcg/act Aers (Albuterol sulfate) .... As needed 5)  Singulair 10 Mg Tabs (Montelukast sodium) .Marland Kitchen.. 1 by mouth once daily 6)  Furosemide 20 Mg Tabs (Furosemide) .... Once daily 7)  Astepro 137 Mcg/spray Soln (Azelastine hcl) .Marland Kitchen.. 1 spray each nostril once daily 8)  Spiriva Handihaler 18 Mcg Caps (Tiotropium bromide monohydrate) .... Once daily 9)  Amlodipine Besy-benazepril Hcl 10-20 Mg Caps (Amlodipine besy-benazepril hcl) .Marland Kitchen.. 1 by mouth once daily 10)  Azithromycin 250 Mg Tabs (Azithromycin) .... 2 by mouth day 1 then one by mouth once daily for 4 days  Patient Instructions: 1)  Acute sinusitis symptoms for less than 10 days are not helped by antibiotics. Use warm moist compresses, and over the counter decongestants( only as directed). Call if no improvement in 5-7 days, sooner  if increasing pain, fever, or new symptoms.  Prescriptions: AZITHROMYCIN 250 MG TABS (AZITHROMYCIN) 2 by mouth day 1 then one by mouth once daily for 4 days  #6 x 0   Entered and Authorized by:   Evelena Peat MD   Signed by:   Evelena Peat MD on 06/01/2009   Method  used:   Print then Give to Patient   RxID:   [id number]

## 2010-05-21 ENCOUNTER — Ambulatory Visit (INDEPENDENT_AMBULATORY_CARE_PROVIDER_SITE_OTHER): Payer: Managed Care, Other (non HMO) | Admitting: Family Medicine

## 2010-05-21 ENCOUNTER — Encounter: Payer: Self-pay | Admitting: Family Medicine

## 2010-05-21 VITALS — BP 120/80 | HR 105 | Temp 98.4°F | Ht 67.0 in | Wt 210.0 lb

## 2010-05-21 DIAGNOSIS — Z0000 Encounter for general adult medical examination without abnormal findings: Secondary | ICD-10-CM

## 2010-05-21 NOTE — Progress Notes (Signed)
  Subjective:    Patient ID: Andrew Swanson, male    DOB: 1960/12/12, 50 y.o.   MRN: [id number]  HPI 50 yr old male for a cpx. He feels great and has no complaints. His asthma is well controlled with weekly shots. He saw Dr. Myrtis Ser last week.    Review of Systems  Constitutional: Negative.  Negative for activity change, appetite change, fatigue and unexpected weight change.  HENT: Negative.  Negative for hearing loss, congestion, sore throat, facial swelling, trouble swallowing, neck pain, neck stiffness, voice change and tinnitus.   Eyes: Negative.  Negative for pain, discharge, redness and visual disturbance.  Respiratory: Negative.  Negative for apnea, cough, choking, chest tightness, shortness of breath, wheezing and stridor.   Cardiovascular: Negative.  Negative for chest pain, palpitations and leg swelling.  Gastrointestinal: Negative.  Negative for nausea, vomiting, abdominal pain, diarrhea, constipation, blood in stool, abdominal distention, anal bleeding and rectal pain.  Genitourinary: Negative.  Negative for dysuria, urgency, frequency, hematuria, flank pain, discharge, scrotal swelling, difficulty urinating and testicular pain.  Musculoskeletal: Negative.  Negative for myalgias, back pain, joint swelling, arthralgias and gait problem.  Skin: Negative.  Negative for color change, pallor, rash and wound.  Neurological: Negative.  Negative for dizziness, tremors, seizures, syncope, speech difficulty, weakness, light-headedness, numbness and headaches.  Hematological: Negative.  Negative for adenopathy. Does not bruise/bleed easily.  Psychiatric/Behavioral: Negative for hallucinations, behavioral problems, confusion, sleep disturbance, dysphoric mood and agitation. The patient is not nervous/anxious and is not hyperactive.        Objective:   Physical Exam  Constitutional: He appears well-developed and well-nourished. No distress.  HENT:  Head: Normocephalic and atraumatic.  Right  Ear: External ear normal.  Left Ear: External ear normal.  Nose: Nose normal.  Mouth/Throat: Oropharynx is clear and moist. No oropharyngeal exudate.  Eyes: Conjunctivae and EOM are normal. Pupils are equal, round, and reactive to light. Right eye exhibits no discharge. Left eye exhibits no discharge. No scleral icterus.  Neck: Normal range of motion. Neck supple. No JVD present. No thyromegaly present.  Cardiovascular: Normal rate, regular rhythm, normal heart sounds and intact distal pulses.  Exam reveals no gallop and no friction rub.   No murmur heard. Pulmonary/Chest: Effort normal and breath sounds normal. No stridor. No respiratory distress. He has no wheezes. He has no rales. He exhibits no tenderness.  Abdominal: Soft. Normal appearance and bowel sounds are normal. He exhibits no distension, no abdominal bruit, no ascites and no mass. There is no hepatosplenomegaly. There is no tenderness. There is no rigidity, no rebound and no guarding. No hernia.  Genitourinary: Testes normal and penis normal. Right testis shows no mass, no swelling and no tenderness. Left testis shows no mass, no swelling and no tenderness. No penile tenderness.  Musculoskeletal: Normal range of motion. He exhibits no edema and no tenderness.  Lymphadenopathy:    He has no cervical adenopathy.  Neurological: He is alert. He has normal reflexes. No cranial nerve deficit. He exhibits normal muscle tone. Coordination normal.  Skin: Skin is warm and dry. No rash noted. He is not diaphoretic. No erythema. No pallor.  Psychiatric: He has a normal mood and affect. His behavior is normal. Judgment and thought content normal.          Assessment & Plan:  He seems to be doing well. Conitnue diet and exercise.

## 2010-05-23 NOTE — Assessment & Plan Note (Signed)
Summary: Andrew Swanson   Visit Type:  Follow-up Referring Provider:  Delano Metz, Allergy Primary Provider:  Gershon Crane, MD  CC:  CAD.  History of Present Illness: The patient is seen for followup of coronary artery disease.  I saw him last July, 2011.  He is a Naval architect.  He had some symptoms and seemed stable but then he had more symptoms and catheterization was done.  It did show mild to moderate nonobstructive disease.  Decision was made to use aggressive secondary prevention.  He's been doing well without any significant symptoms.  Preventive Screening-Counseling & Management  Caffeine-Diet-Exercise     Does Patient Exercise: yes      Drug Use:  yes.    Current Medications (verified): 1)  Symbicort 160-4.5 Mcg/act Aero (Budesonide-Formoterol Fumarate) .Marland Kitchen.. 1 Puff Two Times A Day 2)  Allegra 180 Mg Tabs (Fexofenadine Hcl) .Marland Kitchen.. 1 By Mouth Once Daily 3)  Nasonex 50 Mcg/act Susp (Mometasone Furoate) .... 2 Sprays Each Nostril Once Daily 4)  Proair Hfa 108 (90 Base) Mcg/act Aers (Albuterol Sulfate) .... As Needed 5)  Singulair 10 Mg Tabs (Montelukast Sodium) .Marland Kitchen.. 1 By Mouth Once Daily 6)  Furosemide 20 Mg  Tabs (Furosemide) .... Once Daily 7)  Astepro 137 Mcg/spray Soln (Azelastine Hcl) .Marland Kitchen.. 1 Spray Each Nostril Once Daily 8)  Spiriva Handihaler 18 Mcg Caps (Tiotropium Bromide Monohydrate) .... Once Daily 9)  Amlodipine Besy-Benazepril Hcl 10-20 Mg Caps (Amlodipine Besy-Benazepril Hcl) .Marland Kitchen.. 1 By Mouth Once Daily 10)  Crestor 10 Mg Tabs (Rosuvastatin Calcium) .... Take One Tablet By Mouth Daily. 11)  Aspirin 81 Mg Tbec (Aspirin) .... Once Daily 12)  Omeprazole 40 Mg Cpdr (Omeprazole) .... Once Daily  Allergies (verified): 1)  ! Penicillin V Potassium (Penicillin V Potassium) 2)  ! * Eggs  Past History:  Past Medical History: Asthma, sees Dr. Corinda Gubler Hypertension Allergic rhinitis Chest pain    Stress ECHO  03/2008  [place] Kayser [place]  /  [facility]..08/31/2009....MI ruled out  /  recurrent chest  pain office September 28, 2009 /  catheterization... October 06, 2009... mild to moderate nonobstructive disease.... current pain is not cardiac.... normal LV function.... plan aspirin and a statin CAD.... mild.... nonobstructive.... catheterization.... June, 2011 pneumonia 3-10 Family Hx CAD.Marland Kitchen  Social History: Married Former Smoker  -- around '97 Full Time --- truck driver Alcohol Use - no Regular Exercise - yes  Drug Use - yes -- marijuana last around '97 Does Patient Exercise:  yes Drug Use:  yes  Review of Systems       Patient denies fever, chills, headache, sweats, rash, change in vision, change in hearing, chest pain, cough, nausea vomiting, urinary symptoms.  All of the systems are reviewed and are negative.  Vital Signs:  Patient profile:   50 year old male Height:      66 inches Weight:      210 pounds BMI:     34.02 Pulse rate:   85 / minute BP sitting:   142 / 76  (left arm) Cuff size:   regular  Vitals Entered By: Hardin Negus, RMA (May 15, 2010 3:44 PM)  Physical Exam  General:  patient is stable. Eyes:  no xanthelasma. Neck:  no jugular venous distention. Lungs:  lungs are clear.  Respiratory effort is nonlabored. Heart:  cardiac exam reveals S1-S2.  No clicks or significant murmurs. Abdomen:  abdomen soft. Extremities:  no peripheral edema. Psych:  patient is oriented to person time and place.  Affect is normal.  Impression & Recommendations:  Problem # 1:  CAD (ICD-414.00) Coronary disease is stable.  He will be seeing his primary physician soon.  We will push for aggressive secondary prevention.  He does not need any testing at this time.  Problem # 2:  GERD (ICD-530.81)  His updated medication list for this problem includes:    Omeprazole 40 Mg Cpdr (Omeprazole) ..... Once daily His GERD symptoms are stable.  No change in therapy.  Problem # 3:  HYPERTENSION (ICD-401.9)  His updated medication list for this problem includes:    Furosemide 20  Mg Tabs (Furosemide) ..... Once daily    Amlodipine Besy-benazepril Hcl 10-20 Mg Caps (Amlodipine besy-benazepril hcl) .Marland Kitchen... 1 by mouth once daily    Aspirin 81 Mg Tbec (Aspirin) ..... Once daily blood  pressures controlled.  No change in therapy.  Patient Instructions: 1)  Your physician wants you to follow-up in:  1 year.  You will receive a reminder letter in the mail two months in advance. If you don't receive a letter, please call our office to schedule the follow-up appointment.

## 2010-06-25 ENCOUNTER — Other Ambulatory Visit: Payer: Self-pay | Admitting: Family Medicine

## 2010-07-02 ENCOUNTER — Ambulatory Visit
Admission: RE | Admit: 2010-07-02 | Discharge: 2010-07-02 | Disposition: A | Discharge status: Home / Self Care | Payer: Managed Care, Other (non HMO) | Source: Ambulatory Visit | Attending: Emergency Medicine | Admitting: Emergency Medicine

## 2010-07-02 ENCOUNTER — Other Ambulatory Visit: Payer: Self-pay | Admitting: Emergency Medicine

## 2010-07-02 DIAGNOSIS — R059 Cough, unspecified: Secondary | ICD-10-CM

## 2010-07-02 DIAGNOSIS — R05 Cough: Secondary | ICD-10-CM

## 2010-07-02 LAB — CBC
HCT: 39.6 % (ref 39.0–52.0)
HCT: 41.3 % (ref 39.0–52.0)
Hemoglobin: 13.8 g/dL (ref 13.0–17.0)
Hemoglobin: 14.2 g/dL (ref 13.0–17.0)
MCHC: 34.3 g/dL (ref 30.0–36.0)
MCV: 88.9 fL (ref 78.0–100.0)
MCV: 90.3 fL (ref 78.0–100.0)
Platelets: 291 10*3/uL (ref 150–400)
RBC: 4.64 MIL/uL (ref 4.22–5.81)
RDW: 12.3 % (ref 11.5–15.5)
WBC: 6.9 10*3/uL (ref 4.0–10.5)

## 2010-07-02 LAB — COMPREHENSIVE METABOLIC PANEL
ALT: 30 U/L (ref 0–53)
BUN: 14 mg/dL (ref 6–23)
CO2: 25 mEq/L (ref 19–32)
Calcium: 9 mg/dL (ref 8.4–10.5)
GFR calc non Af Amer: >60 mL/min (ref >60)
Glucose, Bld: 106 mg/dL — ABNORMAL HIGH (ref 70–99)
Sodium: 142 mEq/L (ref 135–145)
Total Protein: 7.3 g/dL (ref 6.0–8.3)

## 2010-07-02 LAB — DIFFERENTIAL
Basophils Relative: 3 % — ABNORMAL HIGH (ref 0–1)
Eosinophils Absolute: 0.5 10*3/uL (ref 0.0–0.7)
Lymphs Abs: 1.7 10*3/uL (ref 0.7–4.0)
Monocytes Relative: 9 % (ref 3–12)
Neutro Abs: 5.1 10*3/uL (ref 1.7–7.7)
Neutrophils Relative %: 62 % (ref 43–77)

## 2010-07-02 LAB — POCT CARDIAC MARKERS
CKMB, poc: 1.2 ng/mL (ref 1.0–8.0)
Myoglobin, poc: 46.6 ng/mL (ref 12–200)
Myoglobin, poc: 81.9 ng/mL (ref 12–200)

## 2010-07-02 LAB — BASIC METABOLIC PANEL
Calcium: 8.6 mg/dL (ref 8.4–10.5)
GFR calc Af Amer: >60 mL/min (ref >60)
GFR calc non Af Amer: >60 mL/min (ref >60)
Glucose, Bld: 90 mg/dL (ref 70–99)
Sodium: 139 mEq/L (ref 135–145)

## 2010-07-02 LAB — LIPID PANEL
Cholesterol: 162 mg/dL (ref 0–200)
HDL: 35 mg/dL — ABNORMAL LOW (ref >39)
LDL Cholesterol: 97 mg/dL (ref 0–99)
Total CHOL/HDL Ratio: 4.6 RATIO
Triglycerides: 151 mg/dL — ABNORMAL HIGH (ref <150)
VLDL: 30 mg/dL (ref 0–40)

## 2010-07-02 LAB — CARDIAC PANEL(CRET KIN+CKTOT+MB+TROPI)
CK, MB: 1.2 ng/mL (ref 0.3–4.0)
Relative Index: 1.2 (ref 0.0–2.5)
Total CK: 82 U/L (ref 7–232)
Troponin I: <0.01 ng/mL (ref 0.00–0.06)

## 2010-07-26 LAB — CULTURE, BLOOD (ROUTINE X 2)
Culture: NO GROWTH
Culture: NO GROWTH

## 2010-07-26 LAB — BASIC METABOLIC PANEL
BUN: 20 mg/dL (ref 6–23)
CO2: 25 mEq/L (ref 19–32)
Calcium: 8.3 mg/dL — ABNORMAL LOW (ref 8.4–10.5)
Chloride: 107 mEq/L (ref 96–112)
Chloride: 108 mEq/L (ref 96–112)
Chloride: 110 mEq/L (ref 96–112)
Creatinine, Ser: 1.03 mg/dL (ref 0.4–1.5)
GFR calc Af Amer: >60 mL/min (ref >60)
GFR calc non Af Amer: >60 mL/min (ref >60)
GFR calc non Af Amer: >60 mL/min (ref >60)
Potassium: 3.9 mEq/L (ref 3.5–5.1)
Potassium: 3.9 mEq/L (ref 3.5–5.1)
Potassium: 4.4 mEq/L (ref 3.5–5.1)
Sodium: 140 mEq/L (ref 135–145)

## 2010-07-26 LAB — CBC
HCT: 33.9 % — ABNORMAL LOW (ref 39.0–52.0)
HCT: 34.7 % — ABNORMAL LOW (ref 39.0–52.0)
HCT: 39 % (ref 39.0–52.0)
Hemoglobin: 11.7 g/dL — ABNORMAL LOW (ref 13.0–17.0)
Hemoglobin: 13 g/dL (ref 13.0–17.0)
Hemoglobin: 13.3 g/dL (ref 13.0–17.0)
MCHC: 34.1 g/dL (ref 30.0–36.0)
MCHC: 34.8 g/dL (ref 30.0–36.0)
MCV: 87.5 fL (ref 78.0–100.0)
MCV: 87.8 fL (ref 78.0–100.0)
MCV: 89.6 fL (ref 78.0–100.0)
Platelets: 289 10*3/uL (ref 150–400)
RBC: 3.79 MIL/uL — ABNORMAL LOW (ref 4.22–5.81)
RBC: 3.96 MIL/uL — ABNORMAL LOW (ref 4.22–5.81)
RBC: 4.19 MIL/uL — ABNORMAL LOW (ref 4.22–5.81)
RBC: 4.44 MIL/uL (ref 4.22–5.81)
RDW: 12.8 % (ref 11.5–15.5)
RDW: 13.3 % (ref 11.5–15.5)
WBC: 11.1 10*3/uL — ABNORMAL HIGH (ref 4.0–10.5)
WBC: 5 10*3/uL (ref 4.0–10.5)

## 2010-07-26 LAB — COMPREHENSIVE METABOLIC PANEL
ALT: 33 U/L (ref 0–53)
BUN: 12 mg/dL (ref 6–23)
CO2: 24 mEq/L (ref 19–32)
Calcium: 8.8 mg/dL (ref 8.4–10.5)
GFR calc non Af Amer: >60 mL/min (ref >60)
Glucose, Bld: 100 mg/dL — ABNORMAL HIGH (ref 70–99)
Sodium: 137 mEq/L (ref 135–145)
Total Protein: 7.5 g/dL (ref 6.0–8.3)

## 2010-07-26 LAB — URINALYSIS, ROUTINE W REFLEX MICROSCOPIC
Bilirubin Urine: NEGATIVE
Glucose, UA: NEGATIVE mg/dL
Hgb urine dipstick: NEGATIVE
Hgb urine dipstick: NEGATIVE
Ketones, ur: NEGATIVE mg/dL
Ketones, ur: NEGATIVE mg/dL
Nitrite: NEGATIVE
Protein, ur: NEGATIVE mg/dL
Protein, ur: NEGATIVE mg/dL
Protein, ur: NEGATIVE mg/dL
Specific Gravity, Urine: 1.019 (ref 1.005–1.030)
Urobilinogen, UA: 0.2 mg/dL (ref 0.0–1.0)
Urobilinogen, UA: 0.2 mg/dL (ref 0.0–1.0)
pH: 6 (ref 5.0–8.0)

## 2010-07-26 LAB — POCT I-STAT 3, ART BLOOD GAS (G3+)
pCO2 arterial: 25.2 mmHg — ABNORMAL LOW (ref 35.0–45.0)
pH, Arterial: 7.508 — ABNORMAL HIGH (ref 7.350–7.450)
pO2, Arterial: 84 mmHg (ref 80.0–100.0)

## 2010-07-26 LAB — DIFFERENTIAL
Basophils Relative: 1 % (ref 0–1)
Eosinophils Absolute: 0 10*3/uL (ref 0.0–0.7)
Lymphs Abs: 0.4 10*3/uL — ABNORMAL LOW (ref 0.7–4.0)
Neutro Abs: 6.8 10*3/uL (ref 1.7–7.7)
Neutrophils Relative %: 84 % — ABNORMAL HIGH (ref 43–77)

## 2010-07-26 LAB — EXPECTORATED SPUTUM ASSESSMENT W GRAM STAIN, RFLX TO RESP C

## 2010-07-26 LAB — STREP PNEUMONIAE URINARY ANTIGEN: Strep Pneumo Urinary Antigen: NEGATIVE

## 2010-07-26 LAB — GLUCOSE, CAPILLARY: Glucose-Capillary: 107 mg/dL — ABNORMAL HIGH (ref 70–99)

## 2010-07-26 LAB — LEGIONELLA ANTIGEN, URINE: Legionella Antigen, Urine: NEGATIVE

## 2010-08-28 NOTE — H&P (Signed)
Andrew Swanson, Andrew Swanson NO.:  [id number]   MEDICAL RECORD NO.:  [id number]          PATIENT TYPE:  INP   LOCATION:  5507                         FACILITY:  [facility]   PHYSICIAN:  Gordy Savers, MDDATE OF BIRTH:  [birth year]   DATE OF ADMISSION:  06/22/2008  DATE OF DISCHARGE:                              HISTORY & PHYSICAL   CHIEF COMPLAINT:  Fever.   HISTORY OF PRESENT ILLNESS:  The patient is a 50 year old white  gentleman with a history of asthma and allergic rhinitis.  He was stable  until approximately 3 days prior to admission when he had the onset of  cold symptoms with head congestion and mild cough.  One day prior to  admission, he developed fever and chills.  Today, he developed worsening  fever associated with worsening cough.  He was brought to the [facility] due to increasing fever, cough, and confusion.  The patient  was treated with antipyretics and his mental status normalized.  ED  evaluation included a chest x-ray that revealed no acute findings, but  was worrisome for early right middle lobe pneumonia.  He was noted to  have temperature elevation as high as 103 degrees.  Laboratory studies  were fairly unremarkable with a white count of 8.0.  Arterial blood gas  revealed a respiratory alkalosis, otherwise unremarkable.  After 2 sets  of blood cultures were obtained, the patient was treated with Rocephin  and azithromycin and transported to the hospital for admission.  The  patient is now admitted for further evaluation and treatment of his  acute febrile illness, suspected early pneumonia.   PAST MEDICAL HISTORY:  The patient has a history of asthma and allergic  rhinitis and has been followed by [facility].  He has treated  hypertension.  He has a history of chest pain and was evaluated within  the past couple years at [facility].  He has a prior history of a  negative stress echocardiogram.   ALLERGIES:  PEN-VEE  K.   MEDICAL REGIMEN:  1. Allegra 180 daily.  2. Lotrel 10/20 one daily.  3. Singulair 10 mg daily.  4. Symbicort 160 two puffs b.i.d.  5. Nasacort daily.  6. Astepro Nasal daily.  7. Spiriva 18 mcg daily.  8. Lasix 20 mg daily.   SOCIAL HISTORY:  The patient is married, former smoker.   FAMILY HISTORY:  Noncontributory.  Father died of lung cancer.  Family  history is positive for coronary artery disease, hypertension, and  dyslipidemia.   REVIEW OF SYSTEMS:  Otherwise unremarkable except for his 3-day history  of acute illness.  Cough is described as largely nonproductive.  He  denies any chest pain or shortness of breath.  There has been no recent  active wheezing.   PHYSICAL EXAMINATION:  GENERAL:  Well-developed healthy-appearing male  who was ill, but in no acute distress.  SKIN:  Quite warm to touch and dry.  There is no resting tachypnea.  HEAD AND NECK:  Normal pupil responses.  Conjunctivae slightly injected.  Oropharynx was mildly erythematous.  Neck,  no meningismus or adenopathy.  CHEST:  Few coarse rhonchi bilaterally.  No active wheezing.  CARDIOVASCULAR:  Heart rate of about 90.  ABDOMEN:  Soft and nontender.  No organomegaly or masses.  EXTREMITIES:  No rash.  Peripheral pulses were full.  No edema or  cyanosis.   IMPRESSION:  Acute febrile illness, rule out early pneumonia, rule out  viral illness.   ADDITIONAL DIAGNOSES:  1. Hypertension.  2. Asthma.   DISPOSITION:  The patient will be admitted to hospital.  He will be  treated with pulmonary toilet, antipyretics, and maintained on  parenteral antibiotics pending results of blood cultures.  Followup  chest x-ray will be obtained in the morning.  Urine for pneumococcal  antigen will also be obtained.      Gordy Savers, MD  Electronically Signed     PFK/MEDQ  D:  07/02/2008  T:  07/02/2008  Job:  [id number]

## 2010-08-28 NOTE — Consult Note (Signed)
NAMEJAEKWON, MCCLUNE                ACCOUNT NO.:  [id number]   MEDICAL RECORD NO.:  [id number]          PATIENT TYPE:  INP   LOCATION:  5507                         FACILITY:  [facility]   PHYSICIAN:  Charlcie Cradle. Delford Field, MD, FCCPDATE OF BIRTH:  05[birth year]   DATE OF CONSULTATION:  07/05/2008  DATE OF DISCHARGE:                                 CONSULTATION   A 50 year old male, previous history of asthma, allergic rhinitis,  emphysema ex-smoker for 13 years.  He was stable until 3 days ago when  he developed onset of URI symptoms, head congestion, cough, and then 1  day prior to admission fever to 103 degrees with shaking chills, brought  to [facility] [place] because of fever, cough, and confusion.  He was  admitted for further inpatient care.  Initially, the chest x-ray was  read as no acute findings.  However, upon my postop review, there are  vague bilateral infiltrates that are initially seen on July 01, 2008,  that are more impressive on July 02, 2008, even though on July 02, 2008, her x-ray again was read as normal.  CT scan obtained today on  July 05, 2008, does show evidence of bilateral pulmonary infiltrates.  He was given nevertheless appropriate treatment with Rocephin and  Zithromax.  Initially, it did defervesce.  His white count was 8000, but  had a left shift on initial admission.  White count has come down, but  he is still having dyspnea and difficulty with coughing up thick brown  sputum.  He did not receive a swine flu vaccine because he is allergic  to eggs.  Pulmonary was not consulted on July 05, 2008, after the  admission date of July 01, 2008, because of continuing dyspnea.  He did  miss some of his neb treatments this morning.   PAST MEDICAL HISTORY:  Medical history of allergic asthma and allergies  followed by [facility].  He has had a history of hypertension and  chest pain in the past few years as evaluated at [facility].  Previous history of  negative stress echocardiogram.   ALLERGIES:  PEN VK.   MEDICATIONS PRIOR TO ADMISSION:  Allegra, Lotrel, Singulair, Symbicort,  Nasacort, Astepro, Spiriva, and Lasix.   SOCIAL HISTORY:  Married and ex-smoker.   FAMILY HISTORY:  Noncontributory except for father dying of lung cancer.   PHYSICAL EXAMINATION:  VITAL SIGNS:  T-max initially on admission was  103 though over the last several days he is 98.4 to 96.7; blood pressure  120/72; pulse 80; respirations 20; saturation 99% on 2 liters.  GENERAL:  A middle-aged male in no acute obvious distress.  CHEST:  Inspiratory and expiratory wheeze with poor airflow and  bilateral scattered rhonchi.  CARDIAC:  Regular rate and rhythm with no S3 or S4.  Normal S1 and S2.  No murmur.  ABDOMEN:  Soft and nontender.  There is no organomegaly.  EXTREMITIES:  No clubbing, edema, or venous disease.  SKIN:  Clear without overt lesions.  NEUROLOGIC:  Intact.  The patient is awake and alert.  Moves all fours.  He is oriented x3.  HEENT:  The oral mucosa was dry, did not have an ability to visualize  the nares or the back of the throat adequately with this exam.  NECK:  Supple.  There is no thyromegaly.  There is no jugular venous  distension.  There is no peripheral lymphadenopathy seen.   LABORATORY DATA:  Chest CT scan obtained and reviewed today, does in  fact reveal patchy bilateral airspace disease with involvement most  conspicuous in the posterior right upper lobe and lingula and airspace  disease in the periphery of both lower lobes.  No masses seen.  There is  central lobular emphysema seen.  Upper abdomen was normal.  No pulmonary  emboli are seen.  White count is 4200, hemoglobin 13, platelet count  230,000.  Sodium 141, potassium 44, chloride 110, CO2 of 23, BUN 12,  creatinine of 1.03, blood sugar 178.  Liver functions unremarkable.  Calcium 8.3, blood cultures x2 on July 01, 2008, are negative.  There  is no sputum culture obtained  in this system.   IMPRESSION:  Bilateral patchy airspace disease compatible with severe  atypical community-acquired pneumonia.  The history is also somewhat  concerning for potential H1N1 or acute viral influenza with associated  secondary pneumonia, also rule out sinusitis.  The patient has not yet  received Tamiflu, did not receive a swine flu vaccine this past fall or  winter.  Associated asthmatic bronchitis exacerbation.   RECOMMENDATIONS:  We would reinstitute IV cefepime and IV Zithromax.  Give Tamiflu 75 mg twice daily for 5 days.  Adjust steroids to 40 mg IV  q.8 h.  Give neb treatments every 4 hours.  Give the patient a flutter  valve.  Obtain sinus CT scan and we will follow this patient  expectantly.      Charlcie Cradle Delford Field, MD, [facility]  Electronically Signed     PEW/MEDQ  D:  07/05/2008  T:  07/06/2008  Job:  [id number]   cc:   Gordy Savers, MD

## 2010-08-28 NOTE — Discharge Summary (Signed)
Andrew Swanson, Andrew Swanson                ACCOUNT NO.:  [id number]   MEDICAL RECORD NO.:  [id number]          PATIENT TYPE:  INP   LOCATION:  5507                         FACILITY:  [facility]   PHYSICIAN:  Valerie A. Felicity Coyer, MDDATE OF BIRTH:  [birth year]   DATE OF ADMISSION:  07/01/2008  DATE OF DISCHARGE:  07/07/2008                               DISCHARGE SUMMARY   DISCHARGE DIAGNOSES:  1. Atypical pneumonia, suspected H1N1, ongoing treatment with Tamiflu,      status post pulmonary evaluation.  2. Sinusitis with maxillary inflammation by CT scan, status post      antibiotic therapy.  Continue nasal treatments with allergy      followup as prior to admission.  3. Acute exacerbation of underlying chronic obstructive pulmonary      disease due to atypical pneumonia and sinusitis above.  Continue      steroid taper.  4. History of hypertension.  5. Former smoker.   DISCHARGE MEDICATIONS:  1. Prednisone 10 mg tablets with slow taper of 40 mg x2 days, then 30      mg x2 days, then 20 mg x2 days, and 10 mg x2 days.  2. Tamiflu 75 mg p.o. b.i.d. until gone to complete a 5-day course.  3. Mucinex 600 mg tablet over-the-counter 2 tablets a.m. and 2 tablets      p.m. x5 days, then p.r.n.  The patient is instructed to hold his Lotrel 10/20 until further  evaluation by his primary MD next week due to normal pressure during  this hospital.  All other medications are prior to admission and  include:  1. Lasix 20 mg once daily.  2. Singulair 10 mg once daily.  3. Albuterol inhaler 2 puffs q.4 h. p.r.n. shortness of breath.  4. Allegra 180 mg p.o. daily.  5. Symbicort 160/4.5 one inhalation b.i.d.  6. Spiriva inhaler 1 inhalation daily.  7. Nasonex 50 mcg nasal spray each nostril b.i.d.  8. Astepro 137 mEq 1 spray each nostril b.i.d.   CONDITION ON DISCHARGE:  Medically improved and stable, hemodynamically  normal with O2 saturations of 97% on room air.  Symptoms markedly  improved.   CONSULTS  THIS HOSPITALIZATION:  [facility], Dr. Delford Field.   HOSPITAL COURSE BY PROBLEM:  1. Acute COPD exacerbation due to atypical pneumonia and sinusitis.      The patient is a 50 year old former smoker who came to the      emergency room due to shortness of breath with cough symptoms.  He      had a high-grade fever of 103, but a normal white count at 8.0.      Chest x-ray, questioned early right middle lobe pneumonia and other      laboratory data was unremarkable, so he was admitted for evaluation      and treatment of COPD exacerbation in the setting of presumed      community-acquired pneumonia.  He was treated with pulmonary toilet      and antipyretics and blood cultures were obtained.  These remained      negative and the patient's symptoms were  slow to resolve.  There is      also question of viral etiology of his fever causing exacerbation      of his symptoms with plans for transition to oral prednisone.      However, given increased shortness of breath with wheezing symptoms      and given steroid taper, he was seen in consultation by the      Pulmonary service on March 23.  It was felt at that time the      patient's high fever and atypical infection may have represented      H1N1 infection.  He was subsequently begun on Tamiflu but did      improve, however, of atypical treatment with his azithromycin as      ongoing since the time of admission as well as Maxipime for      coexisting sinusitis as demonstrated on CT sinuses ordered on that      day.  He was continually slowly weaned on his steroid taper in      transition to p.o. prednisone without complication.      Symptomatically, he is much improved and very anxious for discharge      home.  He has been afebrile for over 48 hours at time of discharge,      ambulating without increase in symptoms, tolerating room air O2,      and I have reviewed plans for close followup with his primary MD      early next week.  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  Please see      hospital chart for full details of hospitalization.  The patient is      felt medically stable for discharge home at this time.   Greater than 30 minutes spent on the date of discharge for coordination,  for chart review, patient education and evaluation as well as outpatient  followup planning.      Valerie A. Felicity Coyer, MD  Electronically Signed     VAL/MEDQ  D:  07/07/2008  T:  07/08/2008  Job:  [id number]

## 2010-08-31 NOTE — Assessment & Plan Note (Signed)
[facility] OFFICE NOTE   NAME:Andrew Swanson, Andrew Swanson                       MRN:          [id number]  DATE:01/06/2006                            DOB:          1960-07-18    This is a 50 year old gentleman here for complete physical examination.  In  general, he is doing well except for some allergy troubles.  His asthma has  acted up a little bit this summer but now with the cooler weather he is  breathing a little more easily.  He has also had more nasal congestion for  awhile.  He was on Rhinocort Aqua spray up until a couple of years ago when  we switched to Astelin nasal sprays.  At first, Astelin worked well but now  does not seem to.  Otherwise, he admits to not eating very healthy diet and  getting very little exercise.  For other details of his past medical  history, family history, social history, etc. refer to the introductory note  with him dated May 31, 2002.   ALLERGIES:  PENICILLIN.   CURRENT MEDICATIONS:  1. Fexofenadine 180 mg per day.  2. Singulair 10 mg per day.  3. Advair 250/50 one puff b.i.d.  4. Astelin nasal spray twice daily.  5. Lotrel 10/20 once a day.  6. Atrovent 4 puffs b.i.d.  7. Albuterol inhaler as needed.   OBJECTIVE:  Height 5 feet 7 inches, weight 219, BP 112/82, pulse 80 and  regular.  GENERAL:  He remains overweight.  SKIN:  Clear.  EYES:  Clear.  PHARYNX:  Clear.  NECK:  Supple without lymphadenopathy or masses.  LUNGS:  Clear with good air flow.  CARDIAC:  Rate and rhythm regular without gallops, murmurs or rubs.  Distal  pulses are full.  EKG is within normal limits.  ABDOMEN:  Soft, normal bowel sounds, nontender, no masses.  GENITALIA:  Normal male.  He is circumcised.  EXTREMITIES:  No clubbing, cyanosis or edema.  NEUROLOGIC:  Exam is grossly intact.   He was here for fasting labs on September 10.  This was remarkable for a low  HDL at 38, a high LDL at  409, but was otherwise clear.   ASSESSMENT AND PLAN:  1. Complete physical.  We talked about increasing exercise and losing      weight.  2. Mild hyperlipidemia.  He will work on changing his diet.  3. Hypertension, stable.  4. Asthma, stable.  5. Allergic rhinitis.  Will switch to Veramyst nasal sprays once daily.  I      gave him samples for the next month or so to try.                                   Tera Mater. Clent Ridges, MD   SAF/MedQ  DD:  01/06/2006  DT:  01/08/2006  Job #:  [id number]

## 2010-09-27 ENCOUNTER — Other Ambulatory Visit: Payer: Self-pay | Admitting: Cardiology

## 2010-12-23 ENCOUNTER — Other Ambulatory Visit: Payer: Self-pay | Admitting: Family Medicine

## 2011-01-03 LAB — I-STAT 8, (EC8 V) (CONVERTED LAB)
BUN: 13
Chloride: 107
Glucose, Bld: 110 — ABNORMAL HIGH
Hemoglobin: 15.3
Potassium: 3.9
Sodium: 138
TCO2: 23
pH, Ven: 7.427 — ABNORMAL HIGH

## 2011-01-03 LAB — CBC
Hemoglobin: 14.2
RBC: 4.62
WBC: 13.5 — ABNORMAL HIGH

## 2011-01-03 LAB — POCT CARDIAC MARKERS
Operator id: 270651
Troponin i, poc: <0.05

## 2011-01-03 LAB — D-DIMER, QUANTITATIVE: D-Dimer, Quant: 0.33

## 2011-01-03 LAB — DIFFERENTIAL
Basophils Relative: 0
Lymphocytes Relative: 12
Lymphs Abs: 1.7
Monocytes Absolute: 1
Monocytes Relative: 8
Neutro Abs: 10.5 — ABNORMAL HIGH
Neutrophils Relative %: 78 — ABNORMAL HIGH

## 2011-01-03 LAB — POCT I-STAT CREATININE: Operator id: 270651

## 2011-01-10 LAB — DIFFERENTIAL
Basophils Absolute: 0.1
Basophils Relative: 1
Eosinophils Absolute: 0.4
Eosinophils Relative: 6 — ABNORMAL HIGH
Lymphs Abs: 1.8
Neutrophils Relative %: 60

## 2011-01-10 LAB — BASIC METABOLIC PANEL
BUN: 13
Chloride: 103
Creatinine, Ser: 1
Glucose, Bld: 105 — ABNORMAL HIGH
Potassium: 4.5

## 2011-01-10 LAB — CBC
HCT: 38.1 — ABNORMAL LOW
MCHC: 35.9
MCV: 89.7
Platelets: 349
RDW: 12.1
WBC: 7.3

## 2011-01-10 LAB — POCT CARDIAC MARKERS
Myoglobin, poc: 72.8
Operator id: 1206
Operator id: 5506
Troponin i, poc: <0.05

## 2011-01-27 ENCOUNTER — Other Ambulatory Visit: Payer: Self-pay | Admitting: Family Medicine

## 2011-03-23 ENCOUNTER — Other Ambulatory Visit: Payer: Self-pay | Admitting: Family Medicine

## 2011-03-25 NOTE — Telephone Encounter (Signed)
Pt last seen in office 05/21/10.   Left message to advise pt of need for OV. Ok to fill Rx's?

## 2011-04-04 ENCOUNTER — Other Ambulatory Visit: Payer: Self-pay | Admitting: Family Medicine

## 2011-04-16 ENCOUNTER — Other Ambulatory Visit: Payer: Self-pay | Admitting: Family Medicine

## 2011-04-18 ENCOUNTER — Encounter: Payer: Self-pay | Admitting: Family Medicine

## 2011-04-18 ENCOUNTER — Ambulatory Visit (INDEPENDENT_AMBULATORY_CARE_PROVIDER_SITE_OTHER): Payer: Managed Care, Other (non HMO) | Admitting: Family Medicine

## 2011-04-18 VITALS — BP 110/70 | Temp 98.0°F | Wt 219.0 lb

## 2011-04-18 DIAGNOSIS — B349 Viral infection, unspecified: Secondary | ICD-10-CM

## 2011-04-18 DIAGNOSIS — B9789 Other viral agents as the cause of diseases classified elsewhere: Secondary | ICD-10-CM

## 2011-04-18 NOTE — Patient Instructions (Signed)
Viral Syndrome You or your child has Viral Syndrome. It is the most common infection causing "colds" and infections in the nose, throat, sinuses, and breathing tubes. Sometimes the infection causes nausea, vomiting, or diarrhea. The germ that causes the infection is a virus. No antibiotic or other medicine will kill it. There are medicines that you can take to make you or your child more comfortable.  HOME CARE INSTRUCTIONS   Rest in bed until you start to feel better.   If you have diarrhea or vomiting, eat small amounts of crackers and toast. Soup is helpful.   Do not give aspirin or medicine that contains aspirin to children.   Only take over-the-counter or prescription medicines for pain, discomfort, or fever as directed by your caregiver.  SEEK IMMEDIATE MEDICAL CARE IF:   You or your child has not improved within one week.   You or your child has pain that is not at least partially relieved by over-the-counter medicine.   Thick, colored mucus or blood is coughed up.   Discharge from the nose becomes thick yellow or green.   Diarrhea or vomiting gets worse.   There is any major change in your or your child's condition.   You or your child develops a skin rash, stiff neck, severe headache, or are unable to hold down food or fluid.   You or your child has an oral temperature above 102 F (38.9 C), not controlled by medicine.   Your baby is older than 3 months with a rectal temperature of 102 F (38.9 C) or higher.   Your baby is 3 months old or younger with a rectal temperature of 100.4 F (38 C) or higher.  Document Released: 03/17/2006 Document Revised: 12/12/2010 Document Reviewed: 03/18/2007 Exit[employer] Patient Information 2012 [employer]. 

## 2011-04-18 NOTE — Progress Notes (Signed)
  Subjective:    Patient ID: Andrew Swanson, male    DOB: Apr 26, 1960, 51 y.o.   MRN: [id number]  HPI  Acute visit. Patient had onset this morning of body aches and slight nausea without vomiting. Chills but no fever confirmed. Mild sore throat. Denies any cough. Minimal nasal congestion. Denies any abdominal pain or dysuria. No skin rash. No headache.  Body aches improved with Tylenol. No sick contacts   Review of Systems  Constitutional: Positive for chills and fatigue. Negative for fever.  HENT: Positive for sore throat.   Respiratory: Negative for cough and shortness of breath.   Gastrointestinal: Negative for vomiting and abdominal pain.  Skin: Negative for rash.       Objective:   Physical Exam  Constitutional: He appears well-developed and well-nourished.  HENT:  Right Ear: External ear normal.  Left Ear: External ear normal.  Mouth/Throat: Oropharynx is clear and moist.  Neck: Neck supple.  Cardiovascular: Normal rate and regular rhythm.   Pulmonary/Chest: Effort normal and breath sounds normal. No respiratory distress. He has no wheezes. He has no rales.  Lymphadenopathy:    He has no cervical adenopathy.  Skin: No rash noted.          Assessment & Plan:  Viral syndrome. Doubt influenza with no documented fever. Symptomatic measures with Tylenol or Advil and plenty of fluids.

## 2011-04-22 ENCOUNTER — Encounter: Payer: Self-pay | Admitting: Family

## 2011-04-22 ENCOUNTER — Ambulatory Visit (INDEPENDENT_AMBULATORY_CARE_PROVIDER_SITE_OTHER): Payer: Managed Care, Other (non HMO) | Admitting: Family

## 2011-04-22 VITALS — BP 122/80 | Temp 98.2°F | Wt 216.0 lb

## 2011-04-22 DIAGNOSIS — R05 Cough: Secondary | ICD-10-CM

## 2011-04-22 DIAGNOSIS — R059 Cough, unspecified: Secondary | ICD-10-CM

## 2011-04-22 DIAGNOSIS — J40 Bronchitis, not specified as acute or chronic: Secondary | ICD-10-CM

## 2011-04-22 MED ORDER — AZITHROMYCIN 250 MG PO TABS: 250.0000 mg | ORAL_TABLET | Freq: Every day | ORAL | Status: AC

## 2011-04-22 MED ORDER — PREDNISONE 20 MG PO TABS: ORAL_TABLET | ORAL | Status: DC

## 2011-04-22 NOTE — Patient Instructions (Signed)

## 2011-04-22 NOTE — Progress Notes (Signed)
Subjective:    Patient ID: Andrew Swanson, male    DOB: 1960/12/05, 51 y.o.   MRN: [id number]  HPI This 51 year old white male, former smoker patient of Dr. Clent Ridges F. in with complaints of cough, congestion, fever, chills, body aches it's been going on for about 10 days. Cough is productive with yellow phlegm. Has taken Mucinex DM that initially was helping and is no longer health. He has a history of COPD.   Review of Systems  Constitutional: Positive for fever and chills.  HENT: Positive for congestion and postnasal drip.   Eyes: Negative.   Respiratory: Positive for cough and wheezing.   Cardiovascular: Negative.   Gastrointestinal: Negative.   Genitourinary: Negative.   Musculoskeletal: Negative.   Skin: Negative.   Neurological: Negative.   Hematological: Negative.   Psychiatric/Behavioral: Negative.        Past Medical History  Diagnosis Date  . Asthma     sees Dr Corinda Gubler  . Hypertension   . Allergy   . Chest pain     stress echo 2009 [facility]. MCh 5-19 11  MI ruled out  recuttent chest pain September 28 2009/catherization  October 06 2009 - mild to moderate nonobstructive disease...current pain is not cardiac.. normal LV CAD .Marland Kitchenmild nonobstructive  ..catherization June 2011.   Marland Kitchen Pneumonia     -06-2008    History   Social History  . Marital Status: Married    Spouse Name: N/A    Number of Children: N/A  . Years of Education: N/A   Occupational History  . Not on file.   Social History Main Topics  . Smoking status: Former Games developer  . Smokeless tobacco: Not on file  . Alcohol Use: Not on file  . Drug Use: Not on file  . Sexually Active: Not on file   Other Topics Concern  . Not on file   Social History Narrative  . No narrative on file    Past Surgical History  Procedure Date  . Cardiac catherization     10-06-09 Dr Myrtis Ser showed mild nonobstructive disease    Family History  Problem Relation Age of Onset  . Cancer Father     Allergies  Allergen  Reactions  . Eggs Or Egg-Derived Products     REACTION: vomitting  . Penicillins     REACTION: unspecified    Current Outpatient Prescriptions on File Prior to Visit  Medication Sig Dispense Refill  . albuterol (PROAIR HFA) 108 (90 BASE) MCG/ACT inhaler Use as directed  3 Inhaler  3  . amLODipine-benazepril (LOTREL) 10-20 MG per capsule TAKE 1 CAPSULE DAILY (NEED TO BE SEEN)  90 capsule  3  . azelastine (ASTELIN) 137 MCG/SPRAY nasal spray 1 spray by Nasal route daily. Use in each nostril as directed       . CRESTOR 10 MG tablet TAKE 1 TABLET DAILY  90 tablet  2  . fexofenadine (ALLEGRA) 180 MG tablet Take 180 mg by mouth daily.        . furosemide (LASIX) 20 MG tablet TAKE 1 TABLET ONCE DAILY  90 tablet  3  . mometasone (NASONEX) 50 MCG/ACT nasal spray 2 sprays by Nasal route daily.        . montelukast (SINGULAIR) 10 MG tablet TAKE 1 TABLET DAILY (NEED TO BE SEEN)  90 tablet  0  . omeprazole (PRILOSEC) 40 MG capsule TAKE 1 CAPSULE DAILY  90 capsule  3  . tiotropium (SPIRIVA) 18 MCG inhalation capsule Place 18 mcg  into inhaler and inhale daily.          BP 122/80  Temp(Src) 98.2 F (36.8 C) (Oral)  Wt 216 lb (97.977 kg)chart Objective:   Physical Exam  Constitutional: He is oriented to person, place, and time. He appears well-developed and well-nourished.  HENT:  Right Ear: External ear normal.  Left Ear: External ear normal.  Nose: Nose normal.  Mouth/Throat: Oropharynx is clear and moist.  Neck: Normal range of motion. Neck supple.  Cardiovascular: Normal rate, regular rhythm and normal heart sounds.   Pulmonary/Chest: Effort normal. He has wheezes.       Wheezing noted bilaterally. Good air movement.  Musculoskeletal: Normal range of motion.  Neurological: He is oriented to person, place, and time.  Skin: Skin is warm and dry.  Psychiatric: He has a normal mood and affect.          Assessment & Plan:  Assessment: Rhonchi and, cough  Plan: Prednisone 60x5 days.  Z-Pak as directed. Rest. Drink plenty of fluids. Call if symptoms worsen or persist. Recheck as scheduled, and when necessary.

## 2011-06-03 ENCOUNTER — Other Ambulatory Visit (INDEPENDENT_AMBULATORY_CARE_PROVIDER_SITE_OTHER): Payer: Managed Care, Other (non HMO)

## 2011-06-03 DIAGNOSIS — Z0000 Encounter for general adult medical examination without abnormal findings: Secondary | ICD-10-CM

## 2011-06-03 LAB — CBC WITH DIFFERENTIAL/PLATELET
Basophils Relative: 0.2 % (ref 0.0–3.0)
Eosinophils Absolute: 0.4 10*3/uL (ref 0.0–0.7)
HCT: 42.8 % (ref 39.0–52.0)
Hemoglobin: 14.4 g/dL (ref 13.0–17.0)
Lymphocytes Relative: 21.6 % (ref 12.0–46.0)
Lymphs Abs: 1.9 10*3/uL (ref 0.7–4.0)
MCHC: 33.7 g/dL (ref 30.0–36.0)
Monocytes Relative: 7.7 % (ref 3.0–12.0)
Neutro Abs: 5.9 10*3/uL (ref 1.4–7.7)
RBC: 4.71 Mil/uL (ref 4.22–5.81)
RDW: 13.4 % (ref 11.5–14.6)

## 2011-06-03 LAB — BASIC METABOLIC PANEL
CO2: 27 mEq/L (ref 19–32)
Calcium: 9.3 mg/dL (ref 8.4–10.5)
Chloride: 107 mEq/L (ref 96–112)
Glucose, Bld: 88 mg/dL (ref 70–99)
Potassium: 4.9 mEq/L (ref 3.5–5.1)
Sodium: 142 mEq/L (ref 135–145)

## 2011-06-03 LAB — POCT URINALYSIS DIPSTICK
Bilirubin, UA: NEGATIVE
Glucose, UA: NEGATIVE
Leukocytes, UA: NEGATIVE
Nitrite, UA: NEGATIVE
Urobilinogen, UA: 0.2

## 2011-06-03 LAB — TSH: TSH: 1.64 u[IU]/mL (ref 0.35–5.50)

## 2011-06-03 LAB — HEPATIC FUNCTION PANEL
AST: 22 U/L (ref 0–37)
Albumin: 3.8 g/dL (ref 3.5–5.2)
Alkaline Phosphatase: 68 U/L (ref 39–117)
Total Protein: 6.6 g/dL (ref 6.0–8.3)

## 2011-06-03 LAB — PSA: PSA: 1.58 ng/mL (ref 0.10–4.00)

## 2011-06-03 LAB — LIPID PANEL: Cholesterol: 114 mg/dL (ref 0–200)

## 2011-06-07 NOTE — Progress Notes (Signed)
Quick Note:  Left voice message ______ 

## 2011-06-10 ENCOUNTER — Encounter: Payer: Managed Care, Other (non HMO) | Admitting: Family Medicine

## 2011-07-16 ENCOUNTER — Telehealth: Payer: Self-pay | Admitting: Family Medicine

## 2011-07-16 NOTE — Telephone Encounter (Signed)
Pt had req a refill on some meds, but was told by nurse that he needed an ov first. Pt has been sch for ov with Dr Clent Ridges on 07/23/11 at 3:30pm.

## 2011-07-19 ENCOUNTER — Telehealth: Payer: Self-pay | Admitting: Family Medicine

## 2011-07-19 NOTE — Telephone Encounter (Signed)
Refill request from [facility] ( 90 day supply ) for Symbicort, Astepro, Omeprazole, Crestor, & Nasonex and pt last saw you in 05/2010. Can we refill these?

## 2011-07-21 NOTE — Telephone Encounter (Signed)
We can do the refills on 07-23-11 at his OV. He also will need fasting labs. Either he can come in fasting that day for 8 hours or he can schedule this later

## 2011-07-23 ENCOUNTER — Encounter: Payer: Self-pay | Admitting: Family Medicine

## 2011-07-23 ENCOUNTER — Ambulatory Visit (INDEPENDENT_AMBULATORY_CARE_PROVIDER_SITE_OTHER): Payer: BC Managed Care – PPO | Admitting: Family Medicine

## 2011-07-23 VITALS — BP 130/80 | HR 85 | Temp 98.7°F | Wt 214.0 lb

## 2011-07-23 DIAGNOSIS — J45909 Unspecified asthma, uncomplicated: Secondary | ICD-10-CM

## 2011-07-23 DIAGNOSIS — Z9109 Other allergy status, other than to drugs and biological substances: Secondary | ICD-10-CM

## 2011-07-23 DIAGNOSIS — J309 Allergic rhinitis, unspecified: Secondary | ICD-10-CM

## 2011-07-23 DIAGNOSIS — I10 Essential (primary) hypertension: Secondary | ICD-10-CM

## 2011-07-23 DIAGNOSIS — N529 Male erectile dysfunction, unspecified: Secondary | ICD-10-CM

## 2011-07-23 DIAGNOSIS — E785 Hyperlipidemia, unspecified: Secondary | ICD-10-CM

## 2011-07-23 MED ORDER — OMEPRAZOLE 40 MG PO CPDR: 40.0000 mg | DELAYED_RELEASE_CAPSULE | Freq: Every day | ORAL | Status: DC

## 2011-07-23 MED ORDER — BUDESONIDE-FORMOTEROL FUMARATE 160-4.5 MCG/ACT IN AERO: 2.0000 | INHALATION_SPRAY | Freq: Two times a day (BID) | RESPIRATORY_TRACT | Status: DC

## 2011-07-23 MED ORDER — MOMETASONE FUROATE 50 MCG/ACT NA SUSP: 2.0000 | Freq: Every day | NASAL | Status: DC

## 2011-07-23 MED ORDER — ALBUTEROL SULFATE HFA 108 (90 BASE) MCG/ACT IN AERS: 2.0000 | INHALATION_SPRAY | RESPIRATORY_TRACT | Status: DC | PRN

## 2011-07-23 MED ORDER — FEXOFENADINE HCL 180 MG PO TABS: 180.0000 mg | ORAL_TABLET | Freq: Every day | ORAL | Status: DC

## 2011-07-23 MED ORDER — ROSUVASTATIN CALCIUM 10 MG PO TABS: 10.0000 mg | ORAL_TABLET | Freq: Every day | ORAL | Status: DC

## 2011-07-23 MED ORDER — AMLODIPINE BESY-BENAZEPRIL HCL 10-20 MG PO CAPS: 1.0000 | ORAL_CAPSULE | Freq: Every day | ORAL | Status: DC

## 2011-07-23 MED ORDER — MONTELUKAST SODIUM 10 MG PO TABS: 10.0000 mg | ORAL_TABLET | Freq: Every day | ORAL | Status: DC

## 2011-07-23 MED ORDER — AZELASTINE HCL 0.1 % NA SOLN: 1.0000 | Freq: Every day | NASAL | Status: DC

## 2011-07-23 NOTE — Telephone Encounter (Signed)
Pt has 3:30 appt today.  He did not get this message.

## 2011-07-23 NOTE — Progress Notes (Signed)
  Subjective:    Patient ID: Andrew Swanson, male    DOB: Apr 27, 1960, 51 y.o.   MRN: [id number]  HPI Here for med refills and with questions. He has had some erection problems lately and asks to check his testosterone level. He has felt fine otherwise.   Review of Systems  Constitutional: Negative.   Respiratory: Negative.   Cardiovascular: Negative.        Objective:   Physical Exam  Constitutional: He appears well-developed and well-nourished.  Neck: No thyromegaly present.  Cardiovascular: Normal rate, regular rhythm, normal heart sounds and intact distal pulses.   Pulmonary/Chest: Effort normal and breath sounds normal.  Lymphadenopathy:    He has no cervical adenopathy.          Assessment & Plan:  All meds were refilled. Check a testosterone level today. Set up fasting labs soon for lipids, etc.

## 2011-07-24 LAB — TESTOSTERONE: Testosterone: 218.44 ng/dL — ABNORMAL LOW (ref 350.00–890.00)

## 2011-07-29 ENCOUNTER — Telehealth: Payer: Self-pay | Admitting: Family Medicine

## 2011-07-29 MED ORDER — TESTOSTERONE 20.25 MG/ACT (1.62%) TD GEL: 1.0000 "application " | Freq: Every day | TRANSDERMAL | Status: DC

## 2011-07-29 NOTE — Telephone Encounter (Signed)
Spoke with pt

## 2011-07-29 NOTE — Progress Notes (Signed)
Addended by: Aniceto Boss A on: 07/29/2011 12:34 PM   Modules accepted: Orders

## 2011-07-29 NOTE — Telephone Encounter (Signed)
Patient called stating that his insurance company will not pay for androgel. He stated they will pay for axiron or androderm patch. Please advise/assist.

## 2011-07-29 NOTE — Progress Notes (Signed)
Quick Note:  Spoke with pt and script called in. ______

## 2011-07-29 NOTE — Telephone Encounter (Signed)
Pt needs blood work results °

## 2011-07-30 NOTE — Telephone Encounter (Signed)
Switch to Androderm 5 mg patch to wear one a day. Call in 6 month supply

## 2011-07-31 ENCOUNTER — Telehealth: Payer: Self-pay | Admitting: Family Medicine

## 2011-07-31 MED ORDER — TESTOSTERONE 4 MG/24HR TD PT24: 1.0000 | MEDICATED_PATCH | Freq: Every day | TRANSDERMAL | Status: DC

## 2011-07-31 MED ORDER — TESTOSTERONE 5 MG/24HR TD PT24: 1.0000 | MEDICATED_PATCH | Freq: Every day | TRANSDERMAL | Status: DC

## 2011-07-31 NOTE — Telephone Encounter (Signed)
Pharmacy sent a request, Androderm patch only comes in 2 mg or 4 mg. Dr Clent Ridges did approve the change to 4 mg dose. I spoke with pharmacy and changed in computer.

## 2011-07-31 NOTE — Telephone Encounter (Signed)
Script was changed in computer and I called in to [facility], also spoke with pt.

## 2011-07-31 NOTE — Telephone Encounter (Signed)
Addended by: Aniceto Boss A on: 07/31/2011 08:52 AM   Modules accepted: Orders

## 2011-09-03 ENCOUNTER — Ambulatory Visit (INDEPENDENT_AMBULATORY_CARE_PROVIDER_SITE_OTHER): Payer: BC Managed Care – PPO | Admitting: Family Medicine

## 2011-09-03 ENCOUNTER — Encounter: Payer: Self-pay | Admitting: Family Medicine

## 2011-09-03 VITALS — BP 130/80 | HR 96 | Temp 98.9°F | Ht 67.5 in | Wt 214.0 lb

## 2011-09-03 DIAGNOSIS — E291 Testicular hypofunction: Secondary | ICD-10-CM

## 2011-09-03 DIAGNOSIS — Z0000 Encounter for general adult medical examination without abnormal findings: Secondary | ICD-10-CM

## 2011-09-03 DIAGNOSIS — E785 Hyperlipidemia, unspecified: Secondary | ICD-10-CM

## 2011-09-03 MED ORDER — TRIAMCINOLONE ACETONIDE 0.1 % EX CREA: TOPICAL_CREAM | Freq: Two times a day (BID) | CUTANEOUS | Status: DC

## 2011-09-03 NOTE — Progress Notes (Signed)
  Subjective:    Patient ID: Andrew Swanson, male    DOB: Dec 14, 1960, 51 y.o.   MRN: [id number]  HPI 51 yr old male for a cpx. He feels well and has no complaints. He occasionally gets some itchy hives on his arms and has used triamcinolone cream in the past. He would like to have some more.    Review of Systems  Constitutional: Negative.   HENT: Negative.   Eyes: Negative.   Respiratory: Negative.   Cardiovascular: Negative.   Gastrointestinal: Negative.   Genitourinary: Negative.   Musculoskeletal: Negative.   Skin: Negative.   Neurological: Negative.   Hematological: Negative.   Psychiatric/Behavioral: Negative.        Objective:   Physical Exam  Constitutional: He is oriented to person, place, and time. He appears well-developed and well-nourished. No distress.  HENT:  Head: Normocephalic and atraumatic.  Right Ear: External ear normal.  Left Ear: External ear normal.  Nose: Nose normal.  Mouth/Throat: Oropharynx is clear and moist. No oropharyngeal exudate.  Eyes: Conjunctivae and EOM are normal. Pupils are equal, round, and reactive to light. Right eye exhibits no discharge. Left eye exhibits no discharge. No scleral icterus.  Neck: Neck supple. No JVD present. No tracheal deviation present. No thyromegaly present.  Cardiovascular: Normal rate, regular rhythm, normal heart sounds and intact distal pulses.  Exam reveals no gallop and no friction rub.   No murmur heard.      EKG normal   Pulmonary/Chest: Effort normal and breath sounds normal. No respiratory distress. He has no wheezes. He has no rales. He exhibits no tenderness.  Abdominal: Soft. Bowel sounds are normal. He exhibits no distension and no mass. There is no tenderness. There is no rebound and no guarding.  Genitourinary: Rectum normal, prostate normal and penis normal. Guaiac negative stool. No penile tenderness.  Musculoskeletal: Normal range of motion. He exhibits no edema and no tenderness.    Lymphadenopathy:    He has no cervical adenopathy.  Neurological: He is alert and oriented to person, place, and time. He has normal reflexes. No cranial nerve deficit. He exhibits normal muscle tone. Coordination normal.  Skin: Skin is warm and dry. No rash noted. He is not diaphoretic. No erythema. No pallor.  Psychiatric: He has a normal mood and affect. His behavior is normal. Judgment and thought content normal.          Assessment & Plan:  Well exam. Set up a colonoscopy.

## 2011-11-01 ENCOUNTER — Other Ambulatory Visit: Payer: Self-pay | Admitting: *Deleted

## 2011-11-01 MED ORDER — BUDESONIDE-FORMOTEROL FUMARATE 160-4.5 MCG/ACT IN AERO: 2.0000 | INHALATION_SPRAY | Freq: Two times a day (BID) | RESPIRATORY_TRACT | Status: DC

## 2011-11-01 MED ORDER — TIOTROPIUM BROMIDE MONOHYDRATE 18 MCG IN CAPS: 18.0000 ug | ORAL_CAPSULE | Freq: Every day | RESPIRATORY_TRACT | Status: DC

## 2012-02-06 ENCOUNTER — Encounter: Payer: Self-pay | Admitting: Internal Medicine

## 2012-02-06 ENCOUNTER — Ambulatory Visit (INDEPENDENT_AMBULATORY_CARE_PROVIDER_SITE_OTHER)
Admission: RE | Admit: 2012-02-06 | Discharge: 2012-02-06 | Disposition: A | Discharge status: Home / Self Care | Payer: BC Managed Care – PPO | Source: Ambulatory Visit | Attending: Internal Medicine | Admitting: Internal Medicine

## 2012-02-06 ENCOUNTER — Ambulatory Visit (INDEPENDENT_AMBULATORY_CARE_PROVIDER_SITE_OTHER): Payer: BC Managed Care – PPO | Admitting: Internal Medicine

## 2012-02-06 VITALS — BP 142/88 | HR 85 | Temp 98.3°F | Ht 67.0 in | Wt 221.0 lb

## 2012-02-06 DIAGNOSIS — J449 Chronic obstructive pulmonary disease, unspecified: Secondary | ICD-10-CM

## 2012-02-06 DIAGNOSIS — J309 Allergic rhinitis, unspecified: Secondary | ICD-10-CM

## 2012-02-06 DIAGNOSIS — I10 Essential (primary) hypertension: Secondary | ICD-10-CM

## 2012-02-06 MED ORDER — AMLODIPINE-OLMESARTAN 5-20 MG PO TABS: 1.0000 | ORAL_TABLET | Freq: Every day | ORAL | Status: DC

## 2012-02-06 NOTE — Patient Instructions (Addendum)
Azor 5/20 one daily in place lotrel - if blood pressure too high take two instead.  Continue spiriva daily and just take symbicort 2 puffs every 12 hours if needed after Monday 10/28  Allegra is taken one daily as needed for itching sneezing or runny nose - if your allergies are better you shouldn't need it as much  Prilosec Take 30-60 min before first meal of the day   GERD (REFLUX)  is an extremely common cause of respiratory symptoms, many times with no significant heartburn at all.    It can be treated with medication, but also with lifestyle changes including avoidance of late meals, excessive alcohol, smoking cessation, and avoid fatty foods, chocolate, peppermint, colas, red wine, and acidic juices such as orange juice.  NO MINT OR MENTHOL PRODUCTS SO NO COUGH DROPS  USE SUGARLESS CANDY INSTEAD (jolley ranchers or Stover's)  NO OIL BASED VITAMINS - use powdered substitutes.  Please remember to go to  x-ray department downstairs for your tests - we will call you with the results when they are available.    Please schedule a follow up office visit in 4 weeks, sooner if needed with pft's on return with your medications in two bags (automatic vs as needed)

## 2012-02-06 NOTE — Assessment & Plan Note (Signed)
ACE inhibitors are problematic in  pts with airway complaints because  even experienced pulmonologists can't always distinguish ace effects from copd/asthma.  By themselves they don't actually cause a problem, much like oxygen can't by itself start a fire, but they certainly serve as a powerful catalyst or enhancer for any "fire"  or inflammatory process in the upper airway, be it caused by an ET  tube or more commonly reflux (especially in the obese or pts with known GERD or who are on biphoshonates).    In the era of ARB near equivalency until we have a better handle on the reversibility of the airway problem, it just makes sense to avoid ACEI  entirely in the short run and then decide later, having established a level of airway control using a reasonable limited regimen, whether to add back ace but even then being very careful to observe the pt for worsening airway control and number of meds used/ needed to control symptoms which at baseline today seems extremely high - if fact he is on every medication for allergies and gerd x for pepcid, prednisone and xolair with daily symptoms.  Try off lotrel and on azor 5/20 one daily

## 2012-02-06 NOTE — Assessment & Plan Note (Signed)
  When respiratory symptoms begin or become refractory well after a patient reports complete smoking cessation,  Especially when this wasn't the case while they were smoking, a red flag is raised based on the work of Dr Primitivo Gauze which states:  if you quit smoking when your best day FEV1 is still well preserved it is highly unlikely you will progress to severe disease.  That is to say, once the smoking stops,  the symptoms should not suddenly erupt or markedly worsen.  If so, the differential diagnosis should include  obesity/deconditioning,  LPR/Reflux/Aspiration syndromes,  occult CHF, or  especially side effect of medications commonly used in this population like ace inhibitors  Will try off acei and just use symbicort if needed for sob then regroup with pft's in 4-6 weeks

## 2012-02-06 NOTE — Progress Notes (Signed)
  Subjective:    Patient ID: Andrew Swanson, male    DOB: 1960/09/29  MRN: [id number]  HPI  51 yowm quit 1998 with dx of asthma as child outgrew it and didn't need any meds at all after age 51 and no symptoms until around 2000 with onset of sneezing then dx copd p pna around 2003 and maintained on inhalers ever since then referred 02/06/2012 to pulmonary clinic for copd by allergy/Dr VanWinkle.  02/06/2012 1st pulmonary ov cc allergies "much better on shots" (trees, grass dogs and cats) x 4 years itching and sneezing better but still maintained on astelin, nasonex, singular.  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  Sleeping ok without nocturnal  or early am exacerbation  of respiratory  c/o's or need for noct saba. Also denies any obvious fluctuation of symptoms with weather or environmental changes or other aggravating or alleviating factors except as outlined above.    Review of Systems  Constitutional: Negative for fever, chills, activity change, appetite change and unexpected weight change.  HENT: Positive for sneezing. Negative for congestion, sore throat, rhinorrhea, trouble swallowing, dental problem, voice change and postnasal drip.   Eyes: Negative for visual disturbance.  Respiratory: Positive for shortness of breath. Negative for cough and choking.   Cardiovascular: Negative for chest pain and leg swelling.  Gastrointestinal: Negative for nausea, vomiting and abdominal pain.  Genitourinary: Negative for difficulty urinating.  Musculoskeletal: Negative for arthralgias.  Skin: Negative for rash.  Psychiatric/Behavioral: Negative for behavioral problems and confusion.       Objective:   Physical Exam amb obese wm nad Wt Readings from Last 3  Encounters:  02/06/12 221 lb (100.245 kg)  09/03/11 214 lb (97.07 kg)  07/23/11 214 lb (97.07 kg)     HEENT mild turbinate edema.  Oropharynx no thrush or excess pnd or cobblestoning.  No JVD or cervical adenopathy. Mild accessory muscle hypertrophy. Trachea midline, nl thryroid. Chest was hyperinflated by percussion with diminished breath sounds and moderate increased exp time without wheeze. Hoover sign positive at mid inspiration. Regular rate and rhythm without murmur gallop or rub or increase P2 or edema.  Abd: no hsm, nl excursion. Ext warm without cyanosis or clubbing.     CXR  02/06/2012 :  No active disease. No significant change.        Assessment & Plan:

## 2012-02-06 NOTE — Assessment & Plan Note (Signed)
-   On allergy shots since around 2009    Each maintenance medication was reviewed in detail including most importantly the difference between maintenance and as needed and under what circumstances the prns are to be used.  Please see instructions for details which were reviewed in writing and the patient given a copy.  Hopefully over time we will see a shorter list of maint meds (if the changes made today prove effective) and less need for prns over time as well.

## 2012-02-07 ENCOUNTER — Encounter: Payer: Self-pay | Admitting: *Deleted

## 2012-02-07 NOTE — Progress Notes (Signed)
Quick Note:  ATC the pt Was advised had the wrong number  Called work number and could not find his ext Will mail a letter for him to call for results ______

## 2012-02-11 ENCOUNTER — Telehealth: Payer: Self-pay | Admitting: Internal Medicine

## 2012-02-11 NOTE — Telephone Encounter (Signed)
Notes Recorded by Nyoka Cowden, MD on 02/06/2012 at 1:01 PM Call pt: Reviewed cxr and no acute change so no change in recommendations made at [facility]  -----  Called, spoke with pt.  Informed him of cxr results and recs per Dr. Sherene Sires.  He verbalized understanding, is aware of pending OV and PFT in Nov, and voiced no further questions/concerns at this time.

## 2012-03-04 ENCOUNTER — Other Ambulatory Visit: Payer: Self-pay | Admitting: Family Medicine

## 2012-03-05 NOTE — Telephone Encounter (Signed)
Call in another 6 months supply

## 2012-03-09 ENCOUNTER — Other Ambulatory Visit: Payer: BC Managed Care – PPO

## 2012-03-11 ENCOUNTER — Other Ambulatory Visit (INDEPENDENT_AMBULATORY_CARE_PROVIDER_SITE_OTHER): Payer: BC Managed Care – PPO

## 2012-03-11 ENCOUNTER — Ambulatory Visit (INDEPENDENT_AMBULATORY_CARE_PROVIDER_SITE_OTHER): Payer: Commercial Managed Care - PPO | Admitting: Internal Medicine

## 2012-03-11 ENCOUNTER — Encounter: Payer: Self-pay | Admitting: Internal Medicine

## 2012-03-11 ENCOUNTER — Ambulatory Visit (INDEPENDENT_AMBULATORY_CARE_PROVIDER_SITE_OTHER): Payer: BC Managed Care – PPO | Admitting: Internal Medicine

## 2012-03-11 VITALS — BP 120/80 | HR 81 | Temp 97.6°F | Ht 67.0 in | Wt 223.0 lb

## 2012-03-11 DIAGNOSIS — I10 Essential (primary) hypertension: Secondary | ICD-10-CM

## 2012-03-11 DIAGNOSIS — J449 Chronic obstructive pulmonary disease, unspecified: Secondary | ICD-10-CM

## 2012-03-11 DIAGNOSIS — E785 Hyperlipidemia, unspecified: Secondary | ICD-10-CM

## 2012-03-11 DIAGNOSIS — E291 Testicular hypofunction: Secondary | ICD-10-CM

## 2012-03-11 LAB — HEPATIC FUNCTION PANEL
ALT: 28 U/L (ref 0–53)
Alkaline Phosphatase: 69 U/L (ref 39–117)
Bilirubin, Direct: 0.1 mg/dL (ref 0.0–0.3)
Total Protein: 6.8 g/dL (ref 6.0–8.3)

## 2012-03-11 LAB — LIPID PANEL
Cholesterol: 122 mg/dL (ref 0–200)
Triglycerides: 116 mg/dL (ref 0.0–149.0)
VLDL: 23.2 mg/dL (ref 0.0–40.0)

## 2012-03-11 LAB — PULMONARY FUNCTION TEST

## 2012-03-11 MED ORDER — PREDNISONE (PAK) 10 MG PO TABS: ORAL_TABLET | ORAL | Status: DC

## 2012-03-11 NOTE — Progress Notes (Signed)
Subjective:    Patient ID: Andrew Swanson, male    DOB: 1960-09-13  MRN: [id number]  HPI  51 yowm quit smoking 1998 with dx of asthma as child outgrew it and didn't need any meds at all after age 51 and no symptoms until around 2000 with onset of sneezing then dx copd p pna around 2003 and maintained on inhalers ever since then referred 02/06/2012 to pulmonary clinic for copd by allergy/Dr VanWinkle > maintained shots once weekly  02/06/2012 1st pulmonary ov cc allergies "much better on shots" (trees, grass dogs and cats) x 4 years itching and sneezing better but still maintained on astelin, nasonex, singular.  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 rec Azor 5/20 one daily in place lotrel - if blood pressure too high take two instead. Continue spiriva daily and just take symbicort 2 puffs every 12 hours if needed after Monday 10/28 Allegra is taken one daily as needed for itching sneezing or runny nose - if your allergies are better you shouldn't need it as much Prilosec Take 30-60 min before first meal of the day  GERD diet Please schedule a follow up office visit in 4 weeks, sooner if needed with pft's on return with your medications in two bags  03/11/2012 f/u ov/Datra Clary cc less nasal and chest congestion in am's since stopping acei but breathing worse x 2 weeks with colder weather. No obvious daytime variabilty or assoc purulent sputum production or cp or chest tightness, subjective wheeze overt sinus or hb symptoms. No unusual exp hx or h/o childhood pna/ asthma or premature birth to his knowledge.   Not using ppi daily as rec, did stop allegra altogether "don't need it anymore:  Sleeping ok without nocturnal  or early am exacerbation  of respiratory  c/o's or  need for noct saba. Also denies any obvious fluctuation of symptoms with weather or environmental changes or other aggravating or alleviating factors except as outlined above.  ROS  The following are not active complaints unless bolded sore throat, dysphagia, dental problems, itching, sneezing,  nasal congestion or excess/ purulent secretions, ear ache,   fever, chills, sweats, unintended wt loss, pleuritic or exertional cp, hemoptysis,  orthopnea pnd or leg swelling, presyncope, palpitations, heartburn, abdominal pain, anorexia, nausea, vomiting, diarrhea  or change in bowel or urinary habits, change in stools or urine, dysuria,hematuria,  rash, arthralgias, visual complaints, headache, numbness weakness or ataxia or problems with walking or coordination,  change in mood/affect or memory.              Objective:   Physical Exam amb obese wm nad Wt Readings from Last 3 Encounters:  03/11/12 223 lb (101.152 kg)  02/06/12 221 lb (100.245 kg)  09/03/11 214 lb (97.07 kg)     HEENT mild turbinate edema.  Oropharynx no thrush or excess pnd or cobblestoning.  No JVD or cervical adenopathy. Mild accessory muscle hypertrophy. Trachea midline, nl thryroid. Chest was hyperinflated by percussion with diminished breath sounds and moderate increased exp time without wheeze. Hoover sign positive at mid inspiration. Regular rate and rhythm without murmur gallop or rub or increase P2 or edema.  Abd: no hsm, nl excursion. Ext warm without cyanosis or clubbing.     CXR  02/06/2012 :  No active disease. No significant change.        Assessment & Plan:

## 2012-03-11 NOTE — Assessment & Plan Note (Addendum)
-   Trial off ACEI 02/06/2012 > symptoms improved  - PFT's 03/11/2012 2.68 (82%)  Ratio 69  DLCO 70 and 115%  - HFA 75% p coaching 03/11/2012   Symptoms are markedly disproportionate to objective findings and not clear this is a lung problem but pt does appear to have difficult airway management issues. DDX of  difficult airways managment all start with A and  include Adherence, Ace Inhibitors, Acid Reflux, Active Sinus Disease, Alpha 1 Antitripsin deficiency, Anxiety masquerading as Airways dz,  ABPA,  allergy(esp in young), Aspiration (esp in elderly), Adverse effects of DPI,  Active smokers, plus two Bs  = Bronchiectasis and Beta blocker use..and one C= CHF  ? How much was ACEI > needs a full 6 weeks off to be sure  ? Acid reflux > rec again ppi be used qd ac and not prn  ? Adherence.   To keep things simple, I have asked the patient to first separate medicines that are perceived as maintenance, that is to be taken daily "no matter what", from those medicines that are taken on only on an as-needed basis and I have given the patient examples of both, and then return to see our NP to generate a  detailed  medication calendar which should be followed until the next physician sees the patient and updates it.    The proper method of use, as well as anticipated side effects, of a metered-dose inhaler are discussed and demonstrated to the patient. Improved effectiveness after extensive coaching during this visit to a level of approximately  So resume symbicort 160 2bid and rx with one 6 d course of prednisone "because that worked before"

## 2012-03-11 NOTE — Progress Notes (Signed)
PFT done today. 

## 2012-03-11 NOTE — Progress Notes (Signed)
Quick Note:  I left voice message with results. ______ 

## 2012-03-11 NOTE — Patient Instructions (Addendum)
Continue Azor 20/10 one daily Symbicort 160 2 every 12 hours  Every am use spiriva one capsule  Omeprazole 40 mg Take 30-60 min before first meal of the day Singulair daily  Crestor daily  All your other medications are taken as needed   Prednisone 10 mg take  4 each am x 2 days,   2 each am x 2 days,  1 each am x2days and stop  See Tammy NP w/in 3  weeks with all your medications, even over the counter meds, separated in two separate bags, the ones you take no matter what vs the ones you stop once you feel better and take only as needed when you feel you need them.   Tammy  will generate for you a new user friendly medication calendar that will put [place] all on the same page re: your medication use.     Without this process, it simply isn't possible to assure that we are providing  your outpatient care  with  the attention to detail we feel you deserve.   If we cannot assure that you're getting that kind of care,  then we cannot manage your problem effectively from this clinic.  Once you have seen Tammy and we are sure that we're all on the same page with your medication use she will arrange follow up with me.

## 2012-03-11 NOTE — Assessment & Plan Note (Signed)
ACE inhibitors are problematic in  pts with airway complaints because  even experienced pulmonologists can't always distinguish ace effects from copd/asthma/pnds/ allergies etc.  By themselves they don't actually cause a problem, much like oxygen can't by itself start a fire, but they certainly serve as a powerful catalyst or enhancer for any "fire"  or inflammatory process in the upper airway, be it caused by an ET  tube or more commonly reflux (especially in the obese or pts with known GERD or who are on biphoshonates) or URI's, due to interference with bradykinin clearance.  The effects of acei on bradykinin levels occurs in 100% of pt's on acei (unless they surreptitiously stop the med!) but the classic cough is only reported in 5%.  This leaves 95% of pts on acei's  with a variety of syndromes including no identifiable symptom in most  vs non-specific symptoms that wax and wane depending on what other insult is occuring at the level of the upper airway - in his case either related to rhinitis or reflux or both.    Based on improvement in what is admittedly all subjective/ non-specific symptoms would avoid ACEI indefinitely.  For now rx azor 40/10 one daily (samples given)

## 2012-04-03 ENCOUNTER — Ambulatory Visit (INDEPENDENT_AMBULATORY_CARE_PROVIDER_SITE_OTHER): Payer: BC Managed Care – PPO | Admitting: Adult Health

## 2012-04-03 ENCOUNTER — Encounter: Payer: Self-pay | Admitting: Adult Health

## 2012-04-03 VITALS — BP 138/84 | HR 88 | Temp 97.8°F | Ht 67.0 in | Wt 229.2 lb

## 2012-04-03 DIAGNOSIS — J449 Chronic obstructive pulmonary disease, unspecified: Secondary | ICD-10-CM

## 2012-04-03 NOTE — Patient Instructions (Addendum)
Continue on current regimen  Follow med calendar closely and bring to each visit.  follow up Dr. Wert  In 3 months and As needed   

## 2012-04-03 NOTE — Progress Notes (Signed)
  Subjective:    Patient ID: Andrew Swanson, male    DOB: [birth year]  MRN: [id number]  HPI  51 yowm quit smoking 1998 with dx of asthma as child outgrew it and didn't need any meds at all after age 50 and no symptoms until around 2000 with onset of sneezing then dx copd p pna around 2003 and maintained on inhalers ever since then referred 02/06/2012 to pulmonary clinic for copd by allergy/Dr VanWinkle > maintained shots once weekly  02/06/2012 1st pulmonary ov cc allergies "much better on shots" (trees, grass dogs and cats) x 4 years itching and sneezing better but still maintained on astelin, nasonex, singular.  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 rec Azor 5/20 one daily in place lotrel - if blood pressure too high take two instead. Continue spiriva daily and just take symbicort 2 puffs every 12 hours if needed after Monday 10/28 Allegra is taken one daily as needed for itching sneezing or runny nose - if your allergies are better you shouldn't need it as much Prilosec Take 30-60 min before first meal of the day  GERD diet Please schedule a follow up office visit in 4 weeks, sooner if needed with pft's on return with your medications in two bags  03/11/2012 f/u ov/Wert cc less nasal and chest congestion in am's since stopping acei but breathing worse x 2 weeks with colder weather. No obvious daytime variabilty or assoc purulent sputum production or cp or chest tightness, subjective wheeze overt sinus or hb symptoms. No unusual exp hx or h/o childhood pna/ asthma or premature birth to his knowledge.  >>pred taper   04/03/2012 Follow up and med review  Patient returns for 1 month followup and medication review. Patient's medications reviewed in detail and organize  into a medication calendar with patient education. Since last visit. Patient is feeling better w/ no wheezing . Cough is much better.  No flare in cough or wheezing .               Objective:   Physical Exam amb obese wm nad     HEENT mild turbinate edema.  Oropharynx no thrush or excess pnd or cobblestoning.  No JVD or cervical adenopathy. Mild accessory muscle hypertrophy. Trachea midline, nl thryroid. Chest was hyperinflated by percussion with diminished breath sounds and moderate increased exp time without wheeze. Hoover sign positive at mid inspiration. Regular rate and rhythm without murmur gallop or rub or increase P2 or edema.  Abd: no hsm, nl excursion. Ext warm without cyanosis or clubbing.     CXR  02/06/2012 :  No active disease. No significant change.        Assessment & Plan:

## 2012-04-03 NOTE — Assessment & Plan Note (Signed)
Improved control  Patient's medications were reviewed today and patient education was given. Computerized medication calendar was adjusted/completed  Plan  Cont on current regimen.

## 2012-04-24 NOTE — Addendum Note (Signed)
Addended by: Boone Master E on: 04/24/2012 12:55 PM   Modules accepted: Orders

## 2012-04-28 ENCOUNTER — Encounter: Payer: Self-pay | Admitting: Internal Medicine

## 2012-04-29 ENCOUNTER — Emergency Department (HOSPITAL_BASED_OUTPATIENT_CLINIC_OR_DEPARTMENT_OTHER)
Admission: EM | Admit: 2012-04-29 | Discharge: 2012-04-29 | Disposition: A | Discharge status: Home / Self Care | Payer: Commercial Managed Care - PPO | Attending: Emergency Medicine | Admitting: Emergency Medicine

## 2012-04-29 ENCOUNTER — Encounter (HOSPITAL_BASED_OUTPATIENT_CLINIC_OR_DEPARTMENT_OTHER): Payer: Self-pay | Admitting: Family Medicine

## 2012-04-29 ENCOUNTER — Emergency Department (HOSPITAL_BASED_OUTPATIENT_CLINIC_OR_DEPARTMENT_OTHER): Payer: Commercial Managed Care - PPO

## 2012-04-29 DIAGNOSIS — Z87891 Personal history of nicotine dependence: Secondary | ICD-10-CM | POA: Insufficient documentation

## 2012-04-29 DIAGNOSIS — Z8701 Personal history of pneumonia (recurrent): Secondary | ICD-10-CM | POA: Insufficient documentation

## 2012-04-29 DIAGNOSIS — Z79899 Other long term (current) drug therapy: Secondary | ICD-10-CM | POA: Insufficient documentation

## 2012-04-29 DIAGNOSIS — J309 Allergic rhinitis, unspecified: Secondary | ICD-10-CM | POA: Insufficient documentation

## 2012-04-29 DIAGNOSIS — J45901 Unspecified asthma with (acute) exacerbation: Secondary | ICD-10-CM | POA: Insufficient documentation

## 2012-04-29 DIAGNOSIS — Z8679 Personal history of other diseases of the circulatory system: Secondary | ICD-10-CM | POA: Insufficient documentation

## 2012-04-29 DIAGNOSIS — J45909 Unspecified asthma, uncomplicated: Secondary | ICD-10-CM

## 2012-04-29 DIAGNOSIS — I10 Essential (primary) hypertension: Secondary | ICD-10-CM | POA: Insufficient documentation

## 2012-04-29 MED ORDER — ALBUTEROL SULFATE (5 MG/ML) 0.5% IN NEBU
INHALATION_SOLUTION | RESPIRATORY_TRACT | Status: AC
  Filled 2012-04-29: qty 1

## 2012-04-29 MED ORDER — ALBUTEROL SULFATE (5 MG/ML) 0.5% IN NEBU
5.0000 mg | INHALATION_SOLUTION | Freq: Once | RESPIRATORY_TRACT | Status: AC
  Administered 2012-04-29: 5 mg via RESPIRATORY_TRACT

## 2012-04-29 MED ORDER — PREDNISONE 20 MG PO TABS: 60.0000 mg | ORAL_TABLET | Freq: Once | ORAL | Status: DC

## 2012-04-29 MED ORDER — AMLODIPINE-OLMESARTAN 10-40 MG PO TABS: 1.0000 | ORAL_TABLET | Freq: Every day | ORAL | Status: DC

## 2012-04-29 MED ORDER — IPRATROPIUM BROMIDE 0.02 % IN SOLN
0.5000 mg | RESPIRATORY_TRACT | Status: DC
  Administered 2012-04-29: 0.5 mg via RESPIRATORY_TRACT

## 2012-04-29 MED ORDER — IPRATROPIUM BROMIDE 0.02 % IN SOLN
RESPIRATORY_TRACT | Status: AC
  Filled 2012-04-29: qty 2.5

## 2012-04-29 MED ORDER — PREDNISONE 50 MG PO TABS
60.0000 mg | ORAL_TABLET | Freq: Once | ORAL | Status: AC
  Administered 2012-04-29: 60 mg via ORAL
  Filled 2012-04-29: qty 1

## 2012-04-29 NOTE — ED Notes (Signed)
Pt c/o shortness of breath since this morning, h/o asthma. 100% saO2, mild accessory use, diminished.

## 2012-04-29 NOTE — ED Provider Notes (Signed)
History     CSN: [id number]  Arrival date & time 04/29/12  1219   First MD Initiated Contact with Andrew Swanson 04/29/12 1404      Chief Complaint  Andrew Swanson presents with  . Asthma    (Consider location/radiation/quality/duration/timing/severity/associated sxs/prior treatment) Andrew Swanson is a 52 y.o. male presenting with asthma. The history is provided by the Andrew Swanson.  Asthma This is a recurrent problem. Associated symptoms include shortness of breath. Pertinent negatives include no chest pain, no abdominal pain and no headaches.   Andrew Swanson with shortness of breath began while driving today. States it feels like his asthma. He was relieved by his inhaler, but had returned. He states he felt somewhat better after the treatments but was still worried about it and came in here. No chest pain. Dry Cough. Sounds like her to no clear trigger of the episode. He feels better now. Past Medical History  Diagnosis Date  . Asthma     sees Dr Corinda Gubler  . Hypertension   . Allergy   . Chest pain     stress echo 2009 [facility]. MCh 5-19 11  MI ruled out  recuttent chest pain September 28 2009/catherization  October 06 2009 - mild to moderate nonobstructive disease...current pain is not cardiac.. normal LV CAD .Marland Kitchenmild nonobstructive  ..catherization June 2011.   Marland Kitchen Pneumonia     -06-2008    Past Surgical History  Procedure Date  . Cardiac catherization     10-06-09 Dr Myrtis Ser showed mild nonobstructive disease    Family History  Problem Relation Age of Onset  . Lung cancer Father     was a smoker/work in coal mines  . Asthma Paternal Grandmother   . Heart disease Father   . Heart disease Mother     History  Substance Use Topics  . Smoking status: Former Smoker -- 4.0 packs/day for 22 years    Types: Cigarettes    Start date: 04/15/1974    Quit date: 04/15/1996  . Smokeless tobacco: Never Used  . Alcohol Use: No      Review of Systems  Constitutional: Negative for activity change and appetite change.   HENT: Negative for neck stiffness.   Eyes: Negative for pain.  Respiratory: Positive for cough and shortness of breath. Negative for chest tightness.   Cardiovascular: Negative for chest pain and leg swelling.  Gastrointestinal: Negative for nausea, vomiting, abdominal pain and diarrhea.  Genitourinary: Negative for flank pain.  Musculoskeletal: Negative for back pain.  Skin: Negative for rash.  Neurological: Negative for weakness, numbness and headaches.  Psychiatric/Behavioral: Negative for behavioral problems.    Allergies  Eggs or egg-derived products and Penicillins  Home Medications   Current Outpatient Rx  Name  Route  Sig  Dispense  Refill  . MULTIVITAMINS PO CAPS   Oral   Take 1 capsule by mouth daily.         . ALBUTEROL SULFATE HFA 108 (90 BASE) MCG/ACT IN AERS   Inhalation   Inhale 2 puffs into the lungs every 4 (four) hours as needed for wheezing or shortness of breath.   3 Inhaler   3   . AMLODIPINE-OLMESARTAN 10-40 MG PO TABS   Oral   Take 1 tablet by mouth daily.   30 tablet   6   . ANDRODERM 4 MG/24HR TD PT24      APPLY ONE  PATCH TOPICALLY TO SKIN ONE TIME PER DAY.   30 patch   5   . AZELASTINE HCL 137  MCG/SPRAY NA SOLN   Nasal   Place 2 sprays into the nose every 12 (twelve) hours as needed. Use in each nostril as directed         . BUDESONIDE-FORMOTEROL FUMARATE 160-4.5 MCG/ACT IN AERO   Inhalation   Inhale 2 puffs into the lungs 2 (two) times daily.   3 Inhaler   3   . FEXOFENADINE HCL 180 MG PO TABS   Oral   Take 180 mg by mouth daily as needed.         . FUROSEMIDE 20 MG PO TABS   Oral   Take 20 mg by mouth daily as needed.          . MOMETASONE FUROATE 50 MCG/ACT NA SUSP   Nasal   Place 2 sprays into the nose every 12 (twelve) hours as needed.         Marland Kitchen MONTELUKAST SODIUM 10 MG PO TABS   Oral   Take 1 tablet (10 mg total) by mouth at bedtime.   90 tablet   3   . OMEPRAZOLE 40 MG PO CPDR   Oral   Take 1 capsule  (40 mg total) by mouth daily.   90 capsule   3   . PREDNISONE 20 MG PO TABS   Oral   Take 3 tablets (60 mg total) by mouth once.   3 tablet   0   . ROSUVASTATIN CALCIUM 10 MG PO TABS   Oral   Take 1 tablet (10 mg total) by mouth daily.   90 tablet   3   . TIOTROPIUM BROMIDE MONOHYDRATE 18 MCG IN CAPS   Inhalation   Place 1 capsule (18 mcg total) into inhaler and inhale daily.   30 capsule   3   . TRIAMCINOLONE ACETONIDE 0.1 % EX CREA   Topical   Apply topically 2 (two) times daily as needed.           BP 137/86  Temp 97.9 F (36.6 C) (Oral)  Resp 20  Ht 5\' 7"  (1.702 m)  Wt 229 lb (103.874 kg)  BMI 35.87 kg/m2  SpO2 100%  Physical Exam  Nursing note and vitals reviewed. Constitutional: He is oriented to person, place, and time. He appears well-developed and well-nourished.  HENT:  Head: Normocephalic and atraumatic.  Eyes: EOM are normal. Pupils are equal, round, and reactive to light.  Neck: Normal range of motion. Neck supple.  Cardiovascular: Normal rate, regular rhythm and normal heart sounds.   No murmur heard. Pulmonary/Chest: Effort normal. He has wheezes.       Mild wheezes and prolonged expirations.  Abdominal: Soft. Bowel sounds are normal. He exhibits no distension and no mass. There is no tenderness. There is no rebound and no guarding.  Musculoskeletal: Normal range of motion. He exhibits no edema.  Neurological: He is alert and oriented to person, place, and time. No cranial nerve deficit.  Skin: Skin is warm and dry.  Psychiatric: He has a normal mood and affect.    ED Course  Procedures (including critical care time)  Labs Reviewed - No data to display Dg Chest 2 View  04/29/2012  *RADIOLOGY REPORT*  Clinical Data: Asthma  CHEST - 2 VIEW  Comparison:  February 06, 2012  Findings:  The lungs are clear.  The heart size and pulmonary vascularity are normal.  No adenopathy.  No bone lesions.  IMPRESSION: No abnormality noted.   Original Report  Authenticated By: Bretta Bang, M.D.  1. Asthma       MDM  Andrew Swanson with acute onset shortness of breath. He had wheezes and is likely his asthma. 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 There is no swelling of his legs. He is a former smoker but does not currently smoke. No swelling of his legs. No hemoptysis. At this point it is like asthma exacerbation, I feel pulmonary embolus or much less likely. Chest x-ray is reassuring. Andrew Swanson feels better and be discharged home with a short course of steroids. He has his inhalers at home already.        Juliet Rude. Rubin Payor, MD 04/29/12 [phone]

## 2012-04-30 ENCOUNTER — Encounter: Payer: Self-pay | Admitting: Internal Medicine

## 2012-05-01 NOTE — Telephone Encounter (Signed)
Please advise alternative to AZOR Dr. Sherene Sires thanks

## 2012-05-08 ENCOUNTER — Encounter: Payer: Self-pay | Admitting: Internal Medicine

## 2012-05-08 ENCOUNTER — Telehealth: Payer: Self-pay | Admitting: Internal Medicine

## 2012-05-08 MED ORDER — TIOTROPIUM BROMIDE MONOHYDRATE 18 MCG IN CAPS: 18.0000 ug | ORAL_CAPSULE | Freq: Every day | RESPIRATORY_TRACT | Status: DC

## 2012-05-08 NOTE — Telephone Encounter (Signed)
Patient insurance does not cover Azor, please advise on possible alternative. Thanks.Carron Curie, CMA

## 2012-05-08 NOTE — Telephone Encounter (Signed)
Rx sent to pharm for spiriva ATC the pt x 2 and the phone picks up and then hangs up [facility] later

## 2012-05-08 NOTE — Telephone Encounter (Signed)
Spoke with pt and notified that spiriva was sent He is asking about the email sent to [facility] re azor alternative Has enough med to last through the wkend Will forward to MW to remind him of the pt advise request (email) Thanks!

## 2012-05-12 MED ORDER — LOSARTAN POTASSIUM 100 MG PO TABS: 100.0000 mg | ORAL_TABLET | Freq: Every day | ORAL | Status: DC

## 2012-05-12 NOTE — Telephone Encounter (Signed)
MW had okayed for losartan 100mg .  #30 sent to requested pharmacy.  Needs ov in 1-2 weeks for recheck.

## 2012-05-13 ENCOUNTER — Encounter: Payer: Self-pay | Admitting: Internal Medicine

## 2012-05-13 NOTE — Telephone Encounter (Signed)
Ov to recheck BP scheduled with TP 2.14.14 @ 1130am.  Pt advised to contact the office if that time does not work for him.  Will sign off.

## 2012-05-29 ENCOUNTER — Ambulatory Visit: Payer: Commercial Managed Care - PPO | Admitting: Adult Health

## 2012-05-30 ENCOUNTER — Other Ambulatory Visit: Payer: Self-pay

## 2012-06-08 ENCOUNTER — Encounter: Payer: Self-pay | Admitting: Family Medicine

## 2012-06-08 ENCOUNTER — Encounter: Payer: Self-pay | Admitting: Internal Medicine

## 2012-06-08 ENCOUNTER — Other Ambulatory Visit: Payer: Self-pay | Admitting: *Deleted

## 2012-06-08 MED ORDER — BUDESONIDE-FORMOTEROL FUMARATE 160-4.5 MCG/ACT IN AERO: 2.0000 | INHALATION_SPRAY | Freq: Two times a day (BID) | RESPIRATORY_TRACT | Status: DC

## 2012-06-09 ENCOUNTER — Other Ambulatory Visit: Payer: Self-pay | Admitting: Internal Medicine

## 2012-06-10 MED ORDER — TESTOSTERONE 20.25 MG/1.25GM (1.62%) TD GEL: 4.0000 "application " | Freq: Every day | TRANSDERMAL | Status: DC

## 2012-06-11 ENCOUNTER — Encounter: Payer: Self-pay | Admitting: Family Medicine

## 2012-06-15 NOTE — Telephone Encounter (Signed)
error 

## 2012-07-10 ENCOUNTER — Other Ambulatory Visit: Payer: Self-pay | Admitting: Family Medicine

## 2012-07-10 ENCOUNTER — Ambulatory Visit (INDEPENDENT_AMBULATORY_CARE_PROVIDER_SITE_OTHER): Payer: Commercial Managed Care - PPO | Admitting: Internal Medicine

## 2012-07-10 ENCOUNTER — Encounter: Payer: Self-pay | Admitting: Internal Medicine

## 2012-07-10 VITALS — BP 134/82 | HR 75 | Temp 97.7°F | Ht 67.0 in | Wt 231.0 lb

## 2012-07-10 DIAGNOSIS — J4489 Other specified chronic obstructive pulmonary disease: Secondary | ICD-10-CM

## 2012-07-10 DIAGNOSIS — J449 Chronic obstructive pulmonary disease, unspecified: Secondary | ICD-10-CM

## 2012-07-10 MED ORDER — FUROSEMIDE 20 MG PO TABS: 20.0000 mg | ORAL_TABLET | Freq: Every day | ORAL | Status: DC | PRN

## 2012-07-10 NOTE — Progress Notes (Signed)
Subjective:    Patient ID: Andrew Swanson, male    DOB: [birth year]  MRN: [id number]  HPI  52 yowm quit smoking 1998 with dx of asthma as child outgrew it and didn't need any meds at all after age 52 and no symptoms until around 2000 with onset of sneezing then dx copd p pna around 2003 and maintained on inhalers ever since then referred 02/06/2012 to pulmonary clinic for copd by allergy/Dr VanWinkle > maintained shots once weekly  02/06/2012 1st pulmonary ov cc allergies "much better on shots" (trees, grass dogs and cats) x 4 years itching and sneezing better but still maintained on astelin, nasonex, singular.  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 rec Azor 5/20 one daily in place lotrel - if blood pressure too high take two instead. Continue spiriva daily and just take symbicort 2 puffs every 12 hours if needed after Monday 10/28 Allegra is taken one daily as needed for itching sneezing or runny nose - if your allergies are better you shouldn't need it as much Prilosec Take 30-60 min before first meal of the day  GERD diet Please schedule a follow up office visit in 4 weeks, sooner if needed with pft's on return with your medications in two bags  03/11/2012 f/u ov/Devarius Nelles cc less nasal and chest congestion in am's since stopping acei but breathing worse x 2 weeks with colder weather.  >>pred taper   04/03/2012 Follow up and med review  Patient returns for 1 month followup and medication review. Patient's medications reviewed in detail and organize into a medication calendar with patient education. Since last visit. Patient is feeling better w/ no wheezing . Cough is much better.    04/30/11  ER for ? Asthma exac  07/10/2012 Malaki Koury/ pulmonary f/u  Chief Complaint   Patient presents with  . Follow-up    Breathing has improved, but c/o nasal congestion, wheezing and dry cough x 2 days.       Still using inhaler with heavy exertion maybe once a week, even with flare of cough/ wheeze x 2 days no increased in saba use - using med calendar well.   No obvious daytime variabilty or assoc   cp or chest tightness, subjective wheeze overt sinus or hb symptoms. No unusual exp hx or h/o childhood pna or premature birth to his knowledge.   Sleeping ok without nocturnal  or early am exacerbation  of respiratory  c/o's or need for noct saba. Also denies any obvious fluctuation of symptoms with weather or environmental changes or other aggravating or alleviating factors except as outlined above  ROS  The following are not active complaints unless bolded sore throat, dysphagia, dental problems, itching, sneezing,  nasal congestion or excess/ purulent secretions, ear ache,   fever, chills, sweats, unintended wt loss, pleuritic or exertional cp, hemoptysis,  orthopnea pnd or leg swelling, presyncope, palpitations, heartburn, abdominal pain, anorexia, nausea, vomiting, diarrhea  or change in bowel or urinary habits, change in stools or urine, dysuria,hematuria,  rash, arthralgias, visual complaints, headache, numbness weakness or ataxia or problems with walking or coordination,  change in mood/affect or memory.             Objective:   Physical Exam amb obese wm nad  HEENT mild turbinate edema.  Oropharynx no thrush or excess pnd or cobblestoning.  No JVD or cervical adenopathy. Mild accessory muscle hypertrophy. Trachea midline, nl thryroid. Chest was hyperinflated by percussion with diminished breath sounds and moderate increased exp time without wheeze. Hoover sign positive at mid inspiration. Regular rate and rhythm without murmur gallop or rub or increase P2 or edema.  Abd: no hsm, nl excursion. Ext warm without cyanosis or clubbing.     CXR  02/06/2012 :  No  active disease. No significant change.        Assessment & Plan:

## 2012-07-10 NOTE — Patient Instructions (Addendum)
Try off spiriva and see if it makes any difference in exercise capacity and if so restart it (high octane fuel)  You do not have significant copd but there asthmatic component that you should be able to control without going to ER  Only use your albuterol (proaire) as a rescue medication to be used if you can't catch your breath by resting or doing a relaxed purse lip breathing pattern. The less you use it, the better it will work when you need it.  The goal is to use is less than twice a week but as per your calendar every 4 hours if needed but don't leave home without it  Work on perfecting inhaler technique:  relax and gently blow all the way out then take a nice smooth deep breath back in, triggering the inhaler at same time you start breathing in.  Hold for up to 5 seconds if you can.  Rinse and gargle with water when done   If your mouth or throat starts to bother you,   I suggest you time the inhaler to your dental care and after using the inhaler(s) brush teeth and tongue with a baking soda containing toothpaste and when you rinse this out, gargle with it first to see if this helps your mouth and throat.     Please schedule a follow up visit in 3 months but call sooner if needed

## 2012-07-12 NOTE — Assessment & Plan Note (Addendum)
-   Trial off ACEI 02/06/2012 > symptoms improved  - PFT's 03/11/2012 2.68 (82%)  Ratio 69  No better  p B2 DLCO 70 and 115%  - HFA 75% p coaching 03/11/2012  -Med Calendar 04/03/2012  -trial off spiriva 07/10/12  The proper method of use, as well as anticipated side effects, of a metered-dose inhaler are discussed and demonstrated to the patient. Improved effectiveness after extensive coaching during this visit to a level of approximately  90%   I had an extended discussion with the patient today lasting 15 to 20 minutes of a 25 minute visit on the following issues:     Each maintenance medication was reviewed in detail including most importantly the difference between maintenance and as needed and under what circumstances the prns are to be used. This was done in the context of a medication calendar review which provided the patient with a user-friendly unambiguous mechanism for medication administration and reconciliation and provides an action plan for all active problems. It is critical that this be shown to every doctor  for modification during the office visit if necessary so the patient can use it as a working document.      Ok to leave off spiriva given how good his baseline is and absence of any subjective evidence of aecopd even during flare of upper airway symptoms

## 2012-07-30 ENCOUNTER — Other Ambulatory Visit: Payer: Self-pay | Admitting: Family Medicine

## 2012-08-03 ENCOUNTER — Encounter (HOSPITAL_COMMUNITY): Payer: Self-pay | Admitting: *Deleted

## 2012-08-04 ENCOUNTER — Encounter: Payer: Self-pay | Admitting: Family Medicine

## 2012-08-05 NOTE — Telephone Encounter (Signed)
Can we refill this? 

## 2012-08-06 MED ORDER — ROSUVASTATIN CALCIUM 10 MG PO TABS: 10.0000 mg | ORAL_TABLET | Freq: Every day | ORAL | Status: DC

## 2012-08-09 ENCOUNTER — Other Ambulatory Visit: Payer: Self-pay | Admitting: Family Medicine

## 2012-08-13 ENCOUNTER — Other Ambulatory Visit: Payer: Self-pay | Admitting: Family Medicine

## 2012-08-13 NOTE — Telephone Encounter (Signed)
Okay for one year  

## 2012-08-15 ENCOUNTER — Encounter: Payer: Self-pay | Admitting: Internal Medicine

## 2012-08-17 MED ORDER — ALBUTEROL SULFATE HFA 108 (90 BASE) MCG/ACT IN AERS: 2.0000 | INHALATION_SPRAY | RESPIRATORY_TRACT | Status: DC | PRN

## 2012-08-28 ENCOUNTER — Other Ambulatory Visit: Payer: Self-pay | Admitting: Family Medicine

## 2012-08-30 ENCOUNTER — Other Ambulatory Visit: Payer: Self-pay | Admitting: Family Medicine

## 2012-09-10 ENCOUNTER — Other Ambulatory Visit: Payer: Self-pay | Admitting: Internal Medicine

## 2012-11-02 ENCOUNTER — Telehealth: Payer: Self-pay | Admitting: Family Medicine

## 2012-11-02 MED ORDER — FUROSEMIDE 20 MG PO TABS: 20.0000 mg | ORAL_TABLET | Freq: Every day | ORAL | Status: DC | PRN

## 2012-11-02 NOTE — Telephone Encounter (Signed)
Refill request for Furosemide 20 mg and [facility], which I did [facility]scribe.

## 2012-11-13 ENCOUNTER — Ambulatory Visit (INDEPENDENT_AMBULATORY_CARE_PROVIDER_SITE_OTHER): Payer: Commercial Managed Care - PPO | Admitting: Internal Medicine

## 2012-11-13 ENCOUNTER — Encounter: Payer: Self-pay | Admitting: Internal Medicine

## 2012-11-13 VITALS — BP 130/80 | HR 76 | Temp 97.6°F | Ht 67.0 in | Wt 229.0 lb

## 2012-11-13 DIAGNOSIS — J449 Chronic obstructive pulmonary disease, unspecified: Secondary | ICD-10-CM

## 2012-11-13 DIAGNOSIS — I10 Essential (primary) hypertension: Secondary | ICD-10-CM

## 2012-11-13 DIAGNOSIS — E669 Obesity, unspecified: Secondary | ICD-10-CM

## 2012-11-13 MED ORDER — BUDESONIDE-FORMOTEROL FUMARATE 160-4.5 MCG/ACT IN AERO: 2.0000 | INHALATION_SPRAY | Freq: Two times a day (BID) | RESPIRATORY_TRACT | Status: DC

## 2012-11-13 NOTE — Assessment & Plan Note (Addendum)
Trial off acei started 02/06/2012 > improved "congestion" 03/11/2012   Adequate control on present rx, reviewed > no change in rx needed  And should avoid acei indefinitely based on previous "refractory" nonspecific resp symptoms

## 2012-11-13 NOTE — Progress Notes (Signed)
Subjective:    Patient ID: Andrew Swanson, male    DOB: June 11, 1960  MRN: [id number]   Brief patient profile:  52 yowm quit smoking 1998 with dx of asthma as child outgrew it and didn't need any meds at all after age 52 and no symptoms until around 2000 with onset of sneezing then dx copd p pna around 2003 and maintained on inhalers ever since then referred 02/06/2012 to pulmonary clinic for copd by allergy/Dr VanWinkle > maintained shots once weekly. Proved to have only GOLD I criteria.   HPI 02/06/2012 1st pulmonary ov cc allergies "much better on shots" (trees, grass dogs and cats) x 4 years itching and sneezing better but still maintained on astelin, nasonex, singular.  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 rec Azor 5/20 one daily in place lotrel - if blood pressure too high take two instead. Continue spiriva daily and just take symbicort 2 puffs every 12 hours if needed after Monday 10/28 Allegra is taken one daily as needed for itching sneezing or runny nose - if your allergies are better you shouldn't need it as much Prilosec Take 30-60 min before first meal of the day  GERD diet Please schedule a follow up office visit in 4 weeks, sooner if needed with pft's on return with your medications in two bags  03/11/2012 f/u ov/Andrew Swanson cc less nasal and chest congestion in am's since stopping acei but breathing worse x 2 weeks with colder weather.  >>pred taper   04/03/2012 Follow up and med review  Patient returns for 1 month followup and medication review. Patient's medications reviewed in detail and organize into a medication calendar with patient education. Since last visit. Patient is feeling better w/ no wheezing . Cough is much better.  rec No change rx     04/30/11  ER for ? Asthma exac  07/10/2012 Andrew Swanson/ pulmonary f/u re cppd / ab Chief Complaint  Patient presents with  . Follow-up    Breathing has improved, but c/o nasal congestion, wheezing and dry cough x 2 days.      rec Try off spiriva and see if it makes any difference in exercise capacity and if so restart it (high octane fuel) You do not have significant copd but there asthmatic component that you should be able to control without going to ER Only use your albuterol (proaire)  As rescue   11/13/2012 f/u ov/Andrew Swanson re GOLD I copd/ using med calendar well Chief Complaint  Patient presents with  . Pulmonary Consult    Pt states breathing is doing well and denies any new co's today.    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  No obvious daytime variabilty or assoc chronic cough or cp or chest tightness, subjective wheeze overt sinus or hb symptoms. No unusual exp hx or h/o childhood pna/ asthma or knowledge of premature birth.   Sleeping ok without nocturnal  or early am exacerbation  of respiratory  c/o's or need for noct saba. Also denies any obvious fluctuation of symptoms with weather or environmental changes or other aggravating or alleviating factors except as outlined above   Current Medications, Allergies, Past Medical History, Past Surgical History, Family History,  and Social History were reviewed in [employer] record.  ROS  The following are not active complaints unless bolded sore throat, dysphagia, dental problems, itching, sneezing,  nasal congestion or excess/ purulent secretions, ear ache,   fever, chills, sweats, unintended wt loss, pleuritic or exertional cp, hemoptysis,  orthopnea pnd or leg swelling, presyncope, palpitations, heartburn, abdominal pain, anorexia, nausea, vomiting, diarrhea  or change in bowel or urinary habits, change in stools or urine, dysuria,hematuria,  rash, arthralgias,  visual complaints, headache, numbness weakness or ataxia or problems with walking or coordination,  change in mood/affect or memory.                 Objective:   Physical Exam amb obese wm nad    Wt Readings from Last 3 Encounters:  11/13/12 229 lb (103.874 kg)  07/10/12 231 lb (104.781 kg)  04/29/12 229 lb (103.874 kg)     HEENT mild turbinate edema.  Oropharynx no thrush or excess pnd or cobblestoning.  No JVD or cervical adenopathy. Mild accessory muscle hypertrophy. Trachea midline, nl thryroid. Chest was hyperinflated by percussion with diminished breath sounds and moderate increased exp time without wheeze. Hoover sign positive at mid inspiration. Regular rate and rhythm without murmur gallop or rub or increase P2 or edema.  Abd: no hsm, nl excursion. Ext warm without cyanosis or clubbing.     CXR  02/06/2012 :  No active disease. No significant change.        Assessment & Plan:

## 2012-11-13 NOTE — Assessment & Plan Note (Addendum)
-   Trial off ACEI 02/06/2012 > symptoms improved  - PFT's 03/11/2012 2.68 (82%)  Ratio 69  No better  p B2 DLCO 70 and 115%  - HFA 90% 11/13/2012   He has progressed from refractory symptoms to almost none on present rx of  symbicort 160 2bid and singulair    Each maintenance medication was reviewed in detail including most importantly the difference between maintenance and as needed and under what circumstances the prns are to be used.  Please see instructions for details which were reviewed in writing and the patient given a copy.    F/u is prn

## 2012-11-13 NOTE — Patient Instructions (Addendum)
Weight control is simply a matter of calorie balance which needs to be tilted in your favor by eating less and exercising more.  To get the most out of exercise, you need to be continuously aware that you are short of breath, but never out of breath, for 30 minutes daily. As you improve, it will actually be easier for you to do the same amount of exercise  in  30 minutes so always push to the level where you are short of breath.  If this does not result in gradual weight reduction then I strongly recommend you see a nutritionist with a food diary x 2 weeks so that we can work out a negative calorie balance which is universally effective in steady weight loss programs.  Think of your calorie balance like you do your bank account where in this case you want the balance to go down so you must take in less calories than you burn up.  It's just that simple:  Hard to do, but easy to understand.  Good luck!   See calendar for specific medication instructions and bring it back for each and every office visit for every healthcare provider you see.  Without it,  you may not receive the best quality medical care that we feel you deserve.  You will note that the calendar groups together  your maintenance  medications that are timed at particular times of the day.  Think of this as your checklist for what your doctor has instructed you to do until your next evaluation to see what benefit  there is  to staying on a consistent group of medications intended to keep you well.  The other group at the bottom is entirely up to you to use as you see fit  for specific symptoms that may arise between visits that require you to treat them on an as needed basis.  Think of this as your action plan or "what if" list.   Separating the top medications from the bottom group is fundamental to providing you adequate care going forward.     Follow up here is as needed.

## 2012-11-13 NOTE — Assessment & Plan Note (Signed)
This does affect his breathing and place him at risk of GERD  Reviewed cal balance issues and referred back to Dr Clent Ridges for f/u

## 2012-11-22 ENCOUNTER — Encounter: Payer: Self-pay | Admitting: Family Medicine

## 2012-12-17 ENCOUNTER — Other Ambulatory Visit: Payer: Self-pay | Admitting: Family Medicine

## 2012-12-18 NOTE — Telephone Encounter (Signed)
Call in for 30 days only. He needs an OV

## 2012-12-24 ENCOUNTER — Encounter: Payer: Self-pay | Admitting: Family Medicine

## 2012-12-25 NOTE — Telephone Encounter (Signed)
Can we refill this? 

## 2012-12-26 ENCOUNTER — Other Ambulatory Visit: Payer: Self-pay | Admitting: Internal Medicine

## 2012-12-29 ENCOUNTER — Encounter: Payer: Self-pay | Admitting: Adult Health

## 2012-12-30 ENCOUNTER — Other Ambulatory Visit: Payer: Self-pay | Admitting: *Deleted

## 2012-12-30 MED ORDER — ALBUTEROL SULFATE HFA 108 (90 BASE) MCG/ACT IN AERS: 2.0000 | INHALATION_SPRAY | RESPIRATORY_TRACT | Status: DC | PRN

## 2013-01-01 ENCOUNTER — Other Ambulatory Visit: Payer: Self-pay | Admitting: Family Medicine

## 2013-01-01 NOTE — Telephone Encounter (Signed)
Can we refill these? 

## 2013-01-14 ENCOUNTER — Other Ambulatory Visit: Payer: Self-pay | Admitting: Family Medicine

## 2013-01-15 NOTE — Telephone Encounter (Signed)
No refills until he has an OV and we can check a level

## 2013-02-09 ENCOUNTER — Ambulatory Visit (INDEPENDENT_AMBULATORY_CARE_PROVIDER_SITE_OTHER): Payer: Commercial Managed Care - PPO | Admitting: Family Medicine

## 2013-02-09 ENCOUNTER — Encounter: Payer: Self-pay | Admitting: Family Medicine

## 2013-02-09 ENCOUNTER — Other Ambulatory Visit: Payer: Self-pay | Admitting: Internal Medicine

## 2013-02-09 VITALS — BP 138/84 | HR 87 | Temp 98.5°F | Wt 225.0 lb

## 2013-02-09 DIAGNOSIS — I10 Essential (primary) hypertension: Secondary | ICD-10-CM

## 2013-02-09 DIAGNOSIS — J45909 Unspecified asthma, uncomplicated: Secondary | ICD-10-CM

## 2013-02-09 DIAGNOSIS — J449 Chronic obstructive pulmonary disease, unspecified: Secondary | ICD-10-CM

## 2013-02-09 DIAGNOSIS — I2510 Atherosclerotic heart disease of native coronary artery without angina pectoris: Secondary | ICD-10-CM

## 2013-02-09 DIAGNOSIS — E291 Testicular hypofunction: Secondary | ICD-10-CM

## 2013-02-09 DIAGNOSIS — J309 Allergic rhinitis, unspecified: Secondary | ICD-10-CM

## 2013-02-09 MED ORDER — MONTELUKAST SODIUM 10 MG PO TABS: ORAL_TABLET | ORAL | Status: DC

## 2013-02-09 MED ORDER — TADALAFIL 20 MG PO TABS: 20.0000 mg | ORAL_TABLET | Freq: Every day | ORAL | Status: DC | PRN

## 2013-02-09 MED ORDER — FUROSEMIDE 20 MG PO TABS: 20.0000 mg | ORAL_TABLET | Freq: Every day | ORAL | Status: DC | PRN

## 2013-02-09 NOTE — Progress Notes (Signed)
  Subjective:    Patient ID: Andrew Swanson, male    DOB: [birth year], 52 y.o.   MRN: [id number]  HPI Here to follow up. He is doing well as far as his BP and COPD go. He is almost out of Androgel. This has helped his libido a lot but he still has trouble maintaining erections.    Review of Systems  Constitutional: Negative.   Respiratory: Negative.   Cardiovascular: Negative.        Objective:   Physical Exam  Constitutional: He appears well-developed and well-nourished.  Cardiovascular: Normal rate, regular rhythm, normal heart sounds and intact distal pulses.   Pulmonary/Chest: Effort normal and breath sounds normal.          Assessment & Plan:  We will check a testosterone level today. Try Cialis

## 2013-02-10 LAB — TESTOSTERONE: Testosterone: 303.13 ng/dL — ABNORMAL LOW (ref 350.00–890.00)

## 2013-02-12 MED ORDER — TESTOSTERONE 20.25 MG/ACT (1.62%) TD GEL: 4.0000 "application " | Freq: Every day | TRANSDERMAL | Status: DC

## 2013-02-12 NOTE — Progress Notes (Signed)
Quick Note:  I left a voice message for pt, released results in my chart and called in script. ______

## 2013-02-12 NOTE — Addendum Note (Signed)
Addended by: Aniceto Boss A on: 02/12/2013 09:46 AM   Modules accepted: Orders

## 2013-02-18 ENCOUNTER — Other Ambulatory Visit: Payer: Self-pay

## 2013-02-23 ENCOUNTER — Encounter: Payer: Self-pay | Admitting: Family Medicine

## 2013-02-25 ENCOUNTER — Encounter: Payer: Self-pay | Admitting: Family Medicine

## 2013-02-25 NOTE — Telephone Encounter (Signed)
Change Cialis to 5 mg every day, call in one year supply

## 2013-02-26 MED ORDER — TADALAFIL 5 MG PO TABS: 5.0000 mg | ORAL_TABLET | Freq: Every day | ORAL | Status: DC | PRN

## 2013-02-26 NOTE — Telephone Encounter (Signed)
This is a duplicate note, however I did [facility].

## 2013-06-19 ENCOUNTER — Other Ambulatory Visit: Payer: Self-pay | Admitting: Internal Medicine

## 2013-07-13 ENCOUNTER — Ambulatory Visit (INDEPENDENT_AMBULATORY_CARE_PROVIDER_SITE_OTHER): Payer: Commercial Managed Care - PPO | Admitting: Licensed Clinical Social Worker

## 2013-07-13 DIAGNOSIS — F3189 Other bipolar disorder: Secondary | ICD-10-CM

## 2013-07-23 ENCOUNTER — Other Ambulatory Visit: Payer: Self-pay | Admitting: Family Medicine

## 2013-07-23 ENCOUNTER — Other Ambulatory Visit: Payer: Self-pay | Admitting: Internal Medicine

## 2013-07-26 ENCOUNTER — Telehealth: Payer: Self-pay | Admitting: Family Medicine

## 2013-07-26 ENCOUNTER — Encounter: Payer: Self-pay | Admitting: Family Medicine

## 2013-07-26 ENCOUNTER — Ambulatory Visit (INDEPENDENT_AMBULATORY_CARE_PROVIDER_SITE_OTHER): Payer: Commercial Managed Care - PPO | Admitting: Family Medicine

## 2013-07-26 VITALS — BP 140/72 | HR 83 | Temp 98.3°F | Ht 67.0 in | Wt 212.0 lb

## 2013-07-26 DIAGNOSIS — F319 Bipolar disorder, unspecified: Secondary | ICD-10-CM

## 2013-07-26 MED ORDER — FLUOXETINE HCL 20 MG PO TABS: 20.0000 mg | ORAL_TABLET | Freq: Every day | ORAL | Status: DC

## 2013-07-26 MED ORDER — LAMOTRIGINE 25 MG PO TABS: 50.0000 mg | ORAL_TABLET | Freq: Every day | ORAL | Status: DC

## 2013-07-26 NOTE — Progress Notes (Signed)
Pre visit review using our clinic review tool, if applicable. No additional management support is needed unless otherwise documented below in the visit note. 

## 2013-07-26 NOTE — Telephone Encounter (Signed)
error 

## 2013-07-27 ENCOUNTER — Encounter: Payer: Self-pay | Admitting: Family Medicine

## 2013-07-27 DIAGNOSIS — F319 Bipolar disorder, unspecified: Secondary | ICD-10-CM | POA: Insufficient documentation

## 2013-07-27 NOTE — Progress Notes (Signed)
   Subjective:    Patient ID: Andrew Swanson, male    DOB: 1960-05-31, 53 y.o.   MRN: [id number]  HPI Here asking for help with recently diagnosed bipolar disorder. He has a long hx of moodiness with frequent depression symptoms like sadness, hopelessness, and social withdrawal. He admits to occasional suicidal thoughts but denies any pans and he says he will never harm himself. He he also has a very short temper and often becomes very angry with coworkers or with family. He has had many arguments with his wife, and she demanded that he see a therapist for this. He met with Richardo Priest who diagnosed him with bipolar disorder, and she recommended he see me for medications. She did not think he would benefit from further therapy. He sleeps well and and has a good appetite.    Review of Systems  Constitutional: Negative.   Neurological: Negative.   Psychiatric/Behavioral: Positive for dysphoric mood, decreased concentration and agitation. Negative for confusion. The patient is nervous/anxious.        Objective:   Physical Exam  Constitutional: He is oriented to person, place, and time. He appears well-developed and well-nourished.  Neurological: He is alert and oriented to person, place, and time.  Psychiatric: He has a normal mood and affect. His behavior is normal. Judgment and thought content normal.          Assessment & Plan:  We will start him on Prozac 20 mg each morning and Lamictal for a total of 50 mg at bedtime. Recheck in 3 weeks

## 2013-08-16 ENCOUNTER — Ambulatory Visit: Payer: Commercial Managed Care - PPO | Admitting: Family Medicine

## 2013-08-23 ENCOUNTER — Ambulatory Visit (INDEPENDENT_AMBULATORY_CARE_PROVIDER_SITE_OTHER): Payer: Commercial Managed Care - PPO | Admitting: Family Medicine

## 2013-08-23 ENCOUNTER — Other Ambulatory Visit: Payer: Self-pay | Admitting: Internal Medicine

## 2013-08-23 ENCOUNTER — Encounter: Payer: Self-pay | Admitting: Family Medicine

## 2013-08-23 VITALS — BP 124/78 | HR 79 | Temp 98.8°F | Ht 67.0 in | Wt 208.0 lb

## 2013-08-23 DIAGNOSIS — F319 Bipolar disorder, unspecified: Secondary | ICD-10-CM

## 2013-08-23 NOTE — Progress Notes (Signed)
   Subjective:    Patient ID: Andrew Swanson, male    DOB: 05-10-1960, 53 y.o.   MRN: [id number]  HPI Here to follow up medications. Last month he started on Prozac and Lamictal and he is pleased with the results. He feels happier and less stressed, and his temper is much more calm. His wife is also pleased with the results.    Review of Systems  Constitutional: Negative.   Psychiatric/Behavioral: Negative.        Objective:   Physical Exam  Constitutional: He appears well-developed and well-nourished.  Psychiatric: He has a normal mood and affect. His behavior is normal. Thought content normal.          Assessment & Plan:  We will continue the current regimen.

## 2013-08-23 NOTE — Progress Notes (Signed)
Pre visit review using our clinic review tool, if applicable. No additional management support is needed unless otherwise documented below in the visit note. 

## 2013-09-07 ENCOUNTER — Other Ambulatory Visit: Payer: Self-pay | Admitting: Internal Medicine

## 2013-09-07 ENCOUNTER — Other Ambulatory Visit: Payer: Self-pay | Admitting: Family Medicine

## 2013-09-08 NOTE — Telephone Encounter (Signed)
I left a voice message for pt, he needs labs before any more refills.

## 2013-09-20 ENCOUNTER — Other Ambulatory Visit: Payer: Self-pay | Admitting: Family Medicine

## 2013-09-20 ENCOUNTER — Other Ambulatory Visit: Payer: Self-pay | Admitting: Internal Medicine

## 2013-09-21 NOTE — Telephone Encounter (Signed)
Call in Androgel 150 grams with 5 rf, also Crestor for a year

## 2013-09-23 ENCOUNTER — Other Ambulatory Visit: Payer: Self-pay | Admitting: Internal Medicine

## 2013-10-02 ENCOUNTER — Other Ambulatory Visit: Payer: Self-pay | Admitting: Internal Medicine

## 2013-10-05 ENCOUNTER — Other Ambulatory Visit: Payer: Self-pay | Admitting: Family Medicine

## 2013-10-18 ENCOUNTER — Encounter: Payer: Self-pay | Admitting: Family Medicine

## 2013-10-19 ENCOUNTER — Other Ambulatory Visit: Payer: Self-pay | Admitting: Family Medicine

## 2013-10-21 ENCOUNTER — Telehealth: Payer: Self-pay | Admitting: Family Medicine

## 2013-10-21 MED ORDER — BUPROPION HCL ER (XL) 150 MG PO TB24: 150.0000 mg | ORAL_TABLET | Freq: Every day | ORAL | Status: DC

## 2013-10-21 NOTE — Telephone Encounter (Signed)
Had to discontinue pt's Prozac, due to change in therapy.

## 2013-10-21 NOTE — Telephone Encounter (Signed)
Tell him to stop the Prozac (this is a low enough dose that he does not need to taper off) and call in Wellbutrin XL 150 mg daily, #30 with 5 rf.

## 2013-11-19 ENCOUNTER — Other Ambulatory Visit: Payer: Self-pay | Admitting: Family Medicine

## 2013-11-19 ENCOUNTER — Other Ambulatory Visit: Payer: Self-pay | Admitting: Internal Medicine

## 2013-11-22 ENCOUNTER — Emergency Department (HOSPITAL_COMMUNITY): Payer: Commercial Managed Care - PPO

## 2013-11-22 ENCOUNTER — Encounter (HOSPITAL_COMMUNITY): Payer: Self-pay | Admitting: Emergency Medicine

## 2013-11-22 ENCOUNTER — Emergency Department (HOSPITAL_COMMUNITY)
Admission: EM | Admit: 2013-11-22 | Discharge: 2013-11-22 | Disposition: A | Discharge status: Home / Self Care | Payer: Commercial Managed Care - PPO | Attending: Emergency Medicine | Admitting: Emergency Medicine

## 2013-11-22 DIAGNOSIS — M25512 Pain in left shoulder: Secondary | ICD-10-CM

## 2013-11-22 DIAGNOSIS — Y92410 Unspecified street and highway as the place of occurrence of the external cause: Secondary | ICD-10-CM | POA: Diagnosis not present

## 2013-11-22 DIAGNOSIS — S8990XA Unspecified injury of unspecified lower leg, initial encounter: Secondary | ICD-10-CM | POA: Diagnosis not present

## 2013-11-22 DIAGNOSIS — S99919A Unspecified injury of unspecified ankle, initial encounter: Secondary | ICD-10-CM

## 2013-11-22 DIAGNOSIS — S4980XA Other specified injuries of shoulder and upper arm, unspecified arm, initial encounter: Secondary | ICD-10-CM | POA: Diagnosis present

## 2013-11-22 DIAGNOSIS — Z9889 Other specified postprocedural states: Secondary | ICD-10-CM | POA: Diagnosis not present

## 2013-11-22 DIAGNOSIS — R404 Transient alteration of awareness: Secondary | ICD-10-CM | POA: Insufficient documentation

## 2013-11-22 DIAGNOSIS — Z8701 Personal history of pneumonia (recurrent): Secondary | ICD-10-CM | POA: Diagnosis not present

## 2013-11-22 DIAGNOSIS — Z880 Allergy status to penicillin: Secondary | ICD-10-CM | POA: Insufficient documentation

## 2013-11-22 DIAGNOSIS — Z87891 Personal history of nicotine dependence: Secondary | ICD-10-CM | POA: Diagnosis not present

## 2013-11-22 DIAGNOSIS — Y9389 Activity, other specified: Secondary | ICD-10-CM | POA: Diagnosis not present

## 2013-11-22 DIAGNOSIS — I10 Essential (primary) hypertension: Secondary | ICD-10-CM | POA: Diagnosis not present

## 2013-11-22 DIAGNOSIS — Z79899 Other long term (current) drug therapy: Secondary | ICD-10-CM | POA: Diagnosis not present

## 2013-11-22 DIAGNOSIS — S46909A Unspecified injury of unspecified muscle, fascia and tendon at shoulder and upper arm level, unspecified arm, initial encounter: Secondary | ICD-10-CM | POA: Insufficient documentation

## 2013-11-22 DIAGNOSIS — I129 Hypertensive chronic kidney disease with stage 1 through stage 4 chronic kidney disease, or unspecified chronic kidney disease: Secondary | ICD-10-CM | POA: Insufficient documentation

## 2013-11-22 DIAGNOSIS — M25561 Pain in right knee: Secondary | ICD-10-CM

## 2013-11-22 DIAGNOSIS — F411 Generalized anxiety disorder: Secondary | ICD-10-CM | POA: Diagnosis not present

## 2013-11-22 DIAGNOSIS — S99929A Unspecified injury of unspecified foot, initial encounter: Secondary | ICD-10-CM

## 2013-11-22 LAB — CBC WITH DIFFERENTIAL/PLATELET
Basophils Absolute: 0.1 10*3/uL (ref 0.0–0.1)
Basophils Relative: 1 % (ref 0–1)
Eosinophils Absolute: 0.6 10*3/uL (ref 0.0–0.7)
Eosinophils Relative: 7 % — ABNORMAL HIGH (ref 0–5)
HCT: 43.5 % (ref 39.0–52.0)
HEMOGLOBIN: 14.4 g/dL (ref 13.0–17.0)
Lymphocytes Relative: 26 % (ref 12–46)
Lymphs Abs: 2.2 10*3/uL (ref 0.7–4.0)
MCH: 30.2 pg (ref 26.0–34.0)
MCHC: 33.1 g/dL (ref 30.0–36.0)
MCV: 91.2 fL (ref 78.0–100.0)
MONO ABS: 0.9 10*3/uL (ref 0.1–1.0)
Monocytes Relative: 11 % (ref 3–12)
NEUTROS ABS: 4.7 10*3/uL (ref 1.7–7.7)
Neutrophils Relative %: 55 % (ref 43–77)
Platelets: 324 10*3/uL (ref 150–400)
RBC: 4.77 MIL/uL (ref 4.22–5.81)
RDW: 13.1 % (ref 11.5–15.5)
WBC: 8.4 10*3/uL (ref 4.0–10.5)

## 2013-11-22 LAB — BASIC METABOLIC PANEL
Anion gap: 14 (ref 5–15)
BUN: 15 mg/dL (ref 6–23)
CHLORIDE: 103 meq/L (ref 96–112)
CO2: 26 meq/L (ref 19–32)
CREATININE: 1.08 mg/dL (ref 0.50–1.35)
Calcium: 9.7 mg/dL (ref 8.4–10.5)
GFR calc non Af Amer: 77 mL/min — ABNORMAL LOW (ref >90)
GFR, EST AFRICAN AMERICAN: 89 mL/min — AB (ref >90)
Glucose, Bld: 108 mg/dL — ABNORMAL HIGH (ref 70–99)
Potassium: 4.2 mEq/L (ref 3.7–5.3)
Sodium: 143 mEq/L (ref 137–147)

## 2013-11-22 MED ORDER — IBUPROFEN 800 MG PO TABS
800.0000 mg | ORAL_TABLET | Freq: Once | ORAL | Status: AC
  Administered 2013-11-22: 800 mg via ORAL
  Filled 2013-11-22: qty 1

## 2013-11-22 MED ORDER — LORAZEPAM 2 MG/ML IJ SOLN
1.0000 mg | Freq: Once | INTRAMUSCULAR | Status: AC
  Administered 2013-11-22: 1 mg via INTRAVENOUS
  Filled 2013-11-22: qty 1

## 2013-11-22 MED ORDER — IBUPROFEN 600 MG PO TABS: 600.0000 mg | ORAL_TABLET | Freq: Four times a day (QID) | ORAL | Status: DC | PRN

## 2013-11-22 MED ORDER — OXYCODONE-ACETAMINOPHEN 5-325 MG PO TABS: 1.0000 | ORAL_TABLET | Freq: Four times a day (QID) | ORAL | Status: DC | PRN

## 2013-11-22 MED ORDER — MORPHINE SULFATE 4 MG/ML IJ SOLN: 4.0000 mg | Freq: Once | INTRAMUSCULAR | Status: DC

## 2013-11-22 MED ORDER — ONDANSETRON HCL 4 MG/2ML IJ SOLN: 4.0000 mg | Freq: Once | INTRAMUSCULAR | Status: DC
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
## 2013-11-22 NOTE — Discharge Instructions (Signed)

## 2013-11-24 ENCOUNTER — Encounter: Payer: Self-pay | Admitting: Family Medicine

## 2013-11-24 ENCOUNTER — Telehealth: Payer: Self-pay | Admitting: Family Medicine

## 2013-11-24 ENCOUNTER — Ambulatory Visit (INDEPENDENT_AMBULATORY_CARE_PROVIDER_SITE_OTHER): Payer: Commercial Managed Care - PPO | Admitting: Family Medicine

## 2013-11-24 VITALS — BP 130/81 | HR 77 | Temp 98.1°F | Ht 67.0 in | Wt 218.0 lb

## 2013-11-24 DIAGNOSIS — S40022D Contusion of left upper arm, subsequent encounter: Principal | ICD-10-CM

## 2013-11-24 DIAGNOSIS — E785 Hyperlipidemia, unspecified: Secondary | ICD-10-CM

## 2013-11-24 DIAGNOSIS — Z5189 Encounter for other specified aftercare: Secondary | ICD-10-CM

## 2013-11-24 DIAGNOSIS — E291 Testicular hypofunction: Secondary | ICD-10-CM

## 2013-11-24 DIAGNOSIS — S40012D Contusion of left shoulder, subsequent encounter: Secondary | ICD-10-CM

## 2013-11-24 DIAGNOSIS — I10 Essential (primary) hypertension: Secondary | ICD-10-CM

## 2013-11-24 LAB — HEPATIC FUNCTION PANEL
ALBUMIN: 3.9 g/dL (ref 3.5–5.2)
ALT: 27 U/L (ref 0–53)
AST: 24 U/L (ref 0–37)
Alkaline Phosphatase: 57 U/L (ref 39–117)
Bilirubin, Direct: 0.1 mg/dL (ref 0.0–0.3)
Total Bilirubin: 0.7 mg/dL (ref 0.2–1.2)
Total Protein: 7 g/dL (ref 6.0–8.3)

## 2013-11-24 LAB — LIPID PANEL
CHOL/HDL RATIO: 3
Cholesterol: 131 mg/dL (ref 0–200)
HDL: 37.7 mg/dL — AB (ref >39.00)
LDL Cholesterol: 69 mg/dL (ref 0–99)
NONHDL: 93.3
Triglycerides: 124 mg/dL (ref 0.0–149.0)
VLDL: 24.8 mg/dL (ref 0.0–40.0)

## 2013-11-24 LAB — TESTOSTERONE: TESTOSTERONE: 280.21 ng/dL — AB (ref 300.00–890.00)

## 2013-11-24 MED ORDER — ALBUTEROL SULFATE HFA 108 (90 BASE) MCG/ACT IN AERS: INHALATION_SPRAY | RESPIRATORY_TRACT | Status: DC

## 2013-11-24 MED ORDER — BUDESONIDE-FORMOTEROL FUMARATE 160-4.5 MCG/ACT IN AERO: INHALATION_SPRAY | RESPIRATORY_TRACT | Status: DC

## 2013-11-24 NOTE — Progress Notes (Signed)
Pre visit review using our clinic review tool, if applicable. No additional management support is needed unless otherwise documented below in the visit note. 

## 2013-11-24 NOTE — Telephone Encounter (Signed)
Relevant patient education assigned to patient using Emmi. ° °
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
## 2013-12-01 ENCOUNTER — Encounter: Payer: Self-pay | Admitting: Family Medicine

## 2013-12-01 ENCOUNTER — Ambulatory Visit (INDEPENDENT_AMBULATORY_CARE_PROVIDER_SITE_OTHER): Payer: Commercial Managed Care - PPO | Admitting: Family Medicine

## 2013-12-01 VITALS — BP 122/78 | HR 80 | Temp 98.7°F | Ht 67.0 in | Wt 217.0 lb

## 2013-12-01 DIAGNOSIS — IMO0002 Reserved for concepts with insufficient information to code with codable children: Secondary | ICD-10-CM

## 2013-12-01 DIAGNOSIS — S40012S Contusion of left shoulder, sequela: Secondary | ICD-10-CM

## 2013-12-01 NOTE — Progress Notes (Signed)
Pre visit review using our clinic review tool, if applicable. No additional management support is needed unless otherwise documented below in the visit note. 
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
## 2013-12-09 DIAGNOSIS — Z0279 Encounter for issue of other medical certificate: Secondary | ICD-10-CM

## 2014-01-27 ENCOUNTER — Other Ambulatory Visit: Payer: Self-pay | Admitting: Family Medicine

## 2014-01-28 ENCOUNTER — Other Ambulatory Visit: Payer: Self-pay

## 2014-01-28 ENCOUNTER — Other Ambulatory Visit: Payer: Self-pay | Admitting: Internal Medicine

## 2014-01-28 ENCOUNTER — Other Ambulatory Visit: Payer: Self-pay | Admitting: Family Medicine

## 2014-01-31 NOTE — Telephone Encounter (Signed)
Refill everything for one year

## 2014-02-02 ENCOUNTER — Encounter: Payer: Self-pay | Admitting: Family Medicine

## 2014-02-02 NOTE — Telephone Encounter (Signed)
Call in Lamotrigine 25 mg to take 2 tabs daily, #60 with 11 rf

## 2014-02-03 MED ORDER — LAMOTRIGINE 25 MG PO TABS: 50.0000 mg | ORAL_TABLET | Freq: Every day | ORAL | Status: DC

## 2014-02-07 ENCOUNTER — Telehealth: Payer: Self-pay

## 2014-02-07 NOTE — Telephone Encounter (Signed)
Pt is a truck Systems developerdriver and [employer] called and needs pt's last office note for asthma with current medications faxed to [phone].

## 2014-02-09 ENCOUNTER — Telehealth: Payer: Self-pay | Admitting: Family Medicine

## 2014-02-09 NOTE — Telephone Encounter (Signed)
Pt would like to know why his company is requesting his records b/c he did not give them permission to.

## 2014-02-09 NOTE — Telephone Encounter (Signed)
I spoke with pt and he will pick up a copy of this from our office. Its ready and up front.

## 2014-02-09 NOTE — Telephone Encounter (Signed)
I printed a office visit for 11/13/12 with Dr. Sandrea HughsMichael Wert where asthma was discussed, also printed a copy of medication list. I put this information in a envelope and its up front ready for pick up. I left a voice message advising pt to pick this up and then he can give to the party that requested it.

## 2014-02-23 ENCOUNTER — Other Ambulatory Visit: Payer: Self-pay | Admitting: Family Medicine

## 2014-03-06 ENCOUNTER — Other Ambulatory Visit: Payer: Self-pay | Admitting: Family Medicine

## 2014-04-01 ENCOUNTER — Other Ambulatory Visit: Payer: Self-pay | Admitting: Family Medicine

## 2014-04-04 NOTE — Telephone Encounter (Signed)
Spoke to the pharmacist.  He stated I could call in a 90 day supply with 1 refill.  Could not call in for 1 month with 5 additional refills.

## 2014-04-04 NOTE — Telephone Encounter (Signed)
Refill Wellbutrin for one year and Androgel for 6 months

## 2014-04-05 ENCOUNTER — Telehealth: Payer: Self-pay | Admitting: Family Medicine

## 2014-04-05 NOTE — Telephone Encounter (Signed)
This should be Androgel 1.62% to apply 4 pumps daily. Call in 150 gm with one rf

## 2014-04-05 NOTE — Telephone Encounter (Signed)
Per pharm androgel 1% is not longer made. Pt needs androgel 1.62% call into [facility] [place]

## 2014-04-06 MED ORDER — TESTOSTERONE 20.25 MG/1.25GM (1.62%) TD GEL: TRANSDERMAL | Status: DC

## 2014-04-06 NOTE — Telephone Encounter (Signed)
I called in new script

## 2014-04-06 NOTE — Addendum Note (Signed)
Addended by: Aniceto BossNIMMONS, Dalia Jollie A on: 04/06/2014 05:50 PM   Modules accepted: Orders, Medications

## 2014-05-13 ENCOUNTER — Other Ambulatory Visit: Payer: Self-pay | Admitting: Family Medicine

## 2014-06-08 ENCOUNTER — Telehealth: Payer: Self-pay | Admitting: Family Medicine

## 2014-06-08 NOTE — Telephone Encounter (Signed)
Patient would need to ask the pharmacy why he was given 8 pills out of the 30.  Dr. Clent RidgesFry nor I would know their reason.

## 2014-06-08 NOTE — Telephone Encounter (Signed)
Pt states his insurance com advised him tospeak w/ dr fry about his CIALIS 5 MG tablet.  Pt only got 8 tabs and his rx is for 30 tabs [facility] [place]

## 2014-06-27 ENCOUNTER — Encounter: Payer: Self-pay | Admitting: Family Medicine

## 2014-06-28 NOTE — Telephone Encounter (Signed)
If the patient is taking the medication for ED, that is the max quantity limit.  Also, I called the patient's plan and was advised it was inactive, I could not attempt to submit a quantity exemption request. Patient will need to call member services regarding that.  Patient might want to consider switching back to the 20 mg, quantity 8 per 30 and cutting it in half.

## 2014-06-28 NOTE — Telephone Encounter (Signed)
Can you check on prior authorization?

## 2014-08-17 ENCOUNTER — Other Ambulatory Visit: Payer: Self-pay | Admitting: Family Medicine

## 2014-08-18 NOTE — Telephone Encounter (Signed)
Refill for 6 months. 

## 2014-08-21 ENCOUNTER — Other Ambulatory Visit: Payer: Self-pay | Admitting: Family Medicine

## 2014-09-25 ENCOUNTER — Emergency Department (HOSPITAL_COMMUNITY): Payer: Commercial Managed Care - PPO

## 2014-09-25 ENCOUNTER — Encounter (HOSPITAL_COMMUNITY): Payer: Self-pay | Admitting: *Deleted

## 2014-09-25 ENCOUNTER — Observation Stay (HOSPITAL_COMMUNITY)
Admission: EM | Admit: 2014-09-25 | Discharge: 2014-09-28 | Disposition: A | Discharge status: Home / Self Care | Payer: Commercial Managed Care - PPO | Attending: Internal Medicine | Admitting: Internal Medicine

## 2014-09-25 DIAGNOSIS — I10 Essential (primary) hypertension: Secondary | ICD-10-CM | POA: Diagnosis present

## 2014-09-25 DIAGNOSIS — Z79899 Other long term (current) drug therapy: Secondary | ICD-10-CM | POA: Diagnosis not present

## 2014-09-25 DIAGNOSIS — K219 Gastro-esophageal reflux disease without esophagitis: Secondary | ICD-10-CM | POA: Diagnosis not present

## 2014-09-25 DIAGNOSIS — Z91012 Allergy to eggs: Secondary | ICD-10-CM | POA: Insufficient documentation

## 2014-09-25 DIAGNOSIS — R05 Cough: Secondary | ICD-10-CM

## 2014-09-25 DIAGNOSIS — R000 Tachycardia, unspecified: Secondary | ICD-10-CM | POA: Insufficient documentation

## 2014-09-25 DIAGNOSIS — Z87891 Personal history of nicotine dependence: Secondary | ICD-10-CM | POA: Insufficient documentation

## 2014-09-25 DIAGNOSIS — J45901 Unspecified asthma with (acute) exacerbation: Principal | ICD-10-CM | POA: Insufficient documentation

## 2014-09-25 DIAGNOSIS — Z825 Family history of asthma and other chronic lower respiratory diseases: Secondary | ICD-10-CM | POA: Diagnosis not present

## 2014-09-25 DIAGNOSIS — Z801 Family history of malignant neoplasm of trachea, bronchus and lung: Secondary | ICD-10-CM | POA: Insufficient documentation

## 2014-09-25 DIAGNOSIS — Z880 Allergy status to penicillin: Secondary | ICD-10-CM | POA: Diagnosis not present

## 2014-09-25 DIAGNOSIS — F319 Bipolar disorder, unspecified: Secondary | ICD-10-CM | POA: Diagnosis not present

## 2014-09-25 DIAGNOSIS — R059 Cough, unspecified: Secondary | ICD-10-CM

## 2014-09-25 LAB — CBC WITH DIFFERENTIAL/PLATELET
BASOS ABS: 0 10*3/uL (ref 0.0–0.1)
BASOS PCT: 0 % (ref 0–1)
Eosinophils Absolute: 0 10*3/uL (ref 0.0–0.7)
Eosinophils Relative: 0 % (ref 0–5)
HEMATOCRIT: 41.9 % (ref 39.0–52.0)
Hemoglobin: 13.9 g/dL (ref 13.0–17.0)
LYMPHS ABS: 0.5 10*3/uL — AB (ref 0.7–4.0)
LYMPHS PCT: 4 % — AB (ref 12–46)
MCH: 30.5 pg (ref 26.0–34.0)
MCHC: 33.2 g/dL (ref 30.0–36.0)
MCV: 91.9 fL (ref 78.0–100.0)
MONO ABS: 0.8 10*3/uL (ref 0.1–1.0)
Monocytes Relative: 7 % (ref 3–12)
Neutro Abs: 10 10*3/uL — ABNORMAL HIGH (ref 1.7–7.7)
Neutrophils Relative %: 89 % — ABNORMAL HIGH (ref 43–77)
PLATELETS: 309 10*3/uL (ref 150–400)
RBC: 4.56 MIL/uL (ref 4.22–5.81)
RDW: 13.5 % (ref 11.5–15.5)
WBC: 11.3 10*3/uL — ABNORMAL HIGH (ref 4.0–10.5)

## 2014-09-25 LAB — BASIC METABOLIC PANEL
Anion gap: 14 (ref 5–15)
BUN: 19 mg/dL (ref 6–20)
CO2: 20 mmol/L — AB (ref 22–32)
Calcium: 8.5 mg/dL — ABNORMAL LOW (ref 8.9–10.3)
Chloride: 103 mmol/L (ref 101–111)
Creatinine, Ser: 0.99 mg/dL (ref 0.61–1.24)
GFR calc Af Amer: >60 mL/min (ref >60)
GFR calc non Af Amer: >60 mL/min (ref >60)
Glucose, Bld: 123 mg/dL — ABNORMAL HIGH (ref 65–99)
Potassium: 3.6 mmol/L (ref 3.5–5.1)
SODIUM: 137 mmol/L (ref 135–145)

## 2014-09-25 MED ORDER — ALBUTEROL (5 MG/ML) CONTINUOUS INHALATION SOLN
10.0000 mg/h | INHALATION_SOLUTION | RESPIRATORY_TRACT | Status: AC
  Administered 2014-09-25: 10 mg/h via RESPIRATORY_TRACT
  Filled 2014-09-25: qty 20

## 2014-09-25 MED ORDER — LEVOFLOXACIN IN D5W 500 MG/100ML IV SOLN
500.0000 mg | INTRAVENOUS | Status: DC
  Administered 2014-09-25 – 2014-09-26 (×2): 500 mg via INTRAVENOUS
  Filled 2014-09-25 (×2): qty 100

## 2014-09-25 MED ORDER — MAGNESIUM SULFATE 2 GM/50ML IV SOLN
2.0000 g | INTRAVENOUS | Status: AC
  Administered 2014-09-25: 2 g via INTRAVENOUS
  Filled 2014-09-25: qty 50

## 2014-09-25 MED ORDER — IPRATROPIUM BROMIDE 0.02 % IN SOLN
0.5000 mg | Freq: Once | RESPIRATORY_TRACT | Status: AC
  Administered 2014-09-25: 0.5 mg via RESPIRATORY_TRACT
  Filled 2014-09-25: qty 2.5

## 2014-09-25 MED ORDER — ALBUTEROL SULFATE (2.5 MG/3ML) 0.083% IN NEBU
5.0000 mg | INHALATION_SOLUTION | Freq: Once | RESPIRATORY_TRACT | Status: AC
  Administered 2014-09-25: 5 mg via RESPIRATORY_TRACT
  Filled 2014-09-25: qty 6

## 2014-09-25 NOTE — H&P (Addendum)
Andrew Swanson is an 54 y.o. male.    Shellia Carwin (pcp)  Chief Complaint: dyspnea HPI: 55 yo male with Asthma apparently c/o sob,  Cough since yesterday.  + brown sputum yesterday.  Pt was more sob today and went to urgent care and was given some albuterol and shot im of solumedrol and sent to ED for evaluation.  ? Subjective fever.  Pt denies cp, palp, n/v, diarrhea, medication noncompliance.  Pt had CXR ? Can't see result but per pt negative today.  Pt will be admitted for asthma exacerbation.    Past Medical History  Diagnosis Date  . Asthma     sees Dr Corinda Gubler  . Hypertension   . Allergy   . Chest pain     stress echo 2009 [facility]. MCh 5-19 11  MI ruled out  recuttent chest pain September 28 2009/catherization  October 06 2009 - mild to moderate nonobstructive disease...current pain is not cardiac.. normal LV CAD .Marland Kitchenmild nonobstructive  ..catherization June 2011.   Marland Kitchen Pneumonia     -06-2008    Past Surgical History  Procedure Laterality Date  . Cardiac catherization      10-06-09 Dr Myrtis Ser showed mild nonobstructive disease    Family History  Problem Relation Age of Onset  . Lung cancer Father     was a smoker/work in coal mines  . Asthma Paternal Grandmother   . Heart disease Father   . Heart disease Mother    Social History:  reports that he quit smoking about 18 years ago. His smoking use included Cigarettes. He started smoking about 40 years ago. He has a 88 pack-year smoking history. He has never used smokeless tobacco. He reports that he does not drink alcohol or use illicit drugs.  Allergies:  Allergies  Allergen Reactions  . Eggs Or Egg-Derived Products     REACTION: vomitting  . Penicillins     REACTION: unspecified     (Not in a hospital admission)  Results for orders placed or performed during the hospital encounter of 09/25/14 (from the past 48 hour(s))  CBC with Differential/Platelet     Status: Abnormal   Collection Time: 09/25/14  7:10 PM  Result Value Ref  Range   WBC 11.3 (H) 4.0 - 10.5 K/uL   RBC 4.56 4.22 - 5.81 MIL/uL   Hemoglobin 13.9 13.0 - 17.0 g/dL   HCT 87.4 92.1 - 10.2 %   MCV 91.9 78.0 - 100.0 fL   MCH 30.5 26.0 - 34.0 pg   MCHC 33.2 30.0 - 36.0 g/dL   RDW 83.9 14.1 - 56.9 %   Platelets 309 150 - 400 K/uL   Neutrophils Relative % 89 (H) 43 - 77 %   Neutro Abs 10.0 (H) 1.7 - 7.7 K/uL   Lymphocytes Relative 4 (L) 12 - 46 %   Lymphs Abs 0.5 (L) 0.7 - 4.0 K/uL   Monocytes Relative 7 3 - 12 %   Monocytes Absolute 0.8 0.1 - 1.0 K/uL   Eosinophils Relative 0 0 - 5 %   Eosinophils Absolute 0.0 0.0 - 0.7 K/uL   Basophils Relative 0 0 - 1 %   Basophils Absolute 0.0 0.0 - 0.1 K/uL  Basic metabolic panel     Status: Abnormal   Collection Time: 09/25/14  7:10 PM  Result Value Ref Range   Sodium 137 135 - 145 mmol/L   Potassium 3.6 3.5 - 5.1 mmol/L   Chloride 103 101 - 111 mmol/L  CO2 20 (L) 22 - 32 mmol/L   Glucose, Bld 123 (H) 65 - 99 mg/dL   BUN 19 6 - 20 mg/dL   Creatinine, Ser 0.99 0.61 - 1.24 mg/dL   Calcium 8.5 (L) 8.9 - 10.3 mg/dL   GFR calc non Af Amer >60 >60 mL/min   GFR calc Af Amer >60 >60 mL/min    Comment: (NOTE) The eGFR has been calculated using the CKD EPI equation. This calculation has not been validated in all clinical situations. eGFR's persistently <60 mL/min signify possible Chronic Kidney Disease.    Anion gap 14 5 - 15   Dg Chest 2 View  09/25/2014   CLINICAL DATA:  Initial evaluation for productive cough, shortness of breath, chest pain history of asthma, pneumonia, hypertension.  EXAM: CHEST  2 VIEW  COMPARISON:  Prior study from 11/22/2013  FINDINGS: The cardiac and mediastinal silhouettes are stable in size and contour, and remain within normal limits.  The lungs are normally inflated. No airspace consolidation, pleural effusion, or pulmonary edema is identified. There is no pneumothorax. Mildly irregular biapical pleural thickening noted.  No acute osseous abnormality identified.  IMPRESSION: No  active cardiopulmonary disease.   Electronically Signed   By: Jeannine Boga M.D.   On: 09/25/2014 19:21    Review of Systems  Constitutional: Positive for fever. Negative for chills, weight loss, malaise/fatigue and diaphoresis.  HENT: Negative.   Eyes: Negative.   Respiratory: Positive for cough, sputum production, shortness of breath and wheezing. Negative for hemoptysis.   Cardiovascular: Negative.   Gastrointestinal: Negative.   Genitourinary: Negative.   Musculoskeletal: Negative.   Skin: Negative.   Neurological: Negative.  Negative for weakness.  Endo/Heme/Allergies: Negative.   Psychiatric/Behavioral: Negative.     Blood pressure 142/72, pulse 101, temperature 99.2 F (37.3 C), temperature source Oral, resp. rate 26, height 5\' 8"  (1.727 m), weight 95.255 kg (210 lb), SpO2 95 %. Physical Exam  Constitutional: He is oriented to person, place, and time. He appears well-developed and well-nourished.  HENT:  Head: Normocephalic and atraumatic.  Eyes: Conjunctivae and EOM are normal. Pupils are equal, round, and reactive to light. No scleral icterus.  Neck: Normal range of motion. Neck supple. No JVD present. No tracheal deviation present. No thyromegaly present.  Cardiovascular: Normal rate and regular rhythm.  Exam reveals no gallop and no friction rub.   No murmur heard. Respiratory: Effort normal. No respiratory distress. He has wheezes. He has no rales. He exhibits no tenderness.  GI: Soft. Bowel sounds are normal. He exhibits no distension. There is no tenderness. There is no rebound and no guarding.  Musculoskeletal: Normal range of motion. He exhibits no edema or tenderness.  Lymphadenopathy:    He has no cervical adenopathy.  Neurological: He is alert and oriented to person, place, and time. He has normal reflexes. He displays normal reflexes. No cranial nerve deficit. He exhibits normal muscle tone. Coordination normal.  Skin: Skin is warm and dry. No rash noted.  No erythema. No pallor.  Psychiatric: He has a normal mood and affect. His behavior is normal. Judgment and thought content normal.     Assessment/Plan Dyspnea, ? Asthma/ Copd exacerbation Solumedrol 80mg  iv q8h dulera 2 puff bid levaquin 500mg  iv qday Albuterol neb q6h and q6h prn  Tachycardia Check trop i q6h x3 Check d dimer If d dimer is positive then CTA chest Check echo  Hyperglycemia Check hga1c  DVT prophylaxis:  SCD, lovenox  Gowri Suchan 09/25/2014, 10:54 PM

## 2014-09-25 NOTE — ED Provider Notes (Signed)
CSN: [id number]     Arrival date & time 09/25/14  1800 History   First MD Initiated Contact with Patient 09/25/14 1810     Chief Complaint  Patient presents with  . Weakness  . Cough     (Consider location/radiation/quality/duration/timing/severity/associated sxs/prior Treatment) HPI Comments: Patient with history of hypertension and asthma presents emergency department with chief complaint of cough, wheezing, and chest congestion. States symptoms have been persistent for the past 4-5 days. He was seen [facility] walk-in clinic today, and was sent to the emergency department for increased work of breathing. Patient was given a breathing treatment and Solu-Medrol IM at [facility] physicians. He states that he has had an associated fever and productive cough. Reports history of pneumonia. There are no aggravating or alleviating factors.  The history is provided by the patient. No language interpreter was used.    Past Medical History  Diagnosis Date  . Asthma     sees Dr Corinda Gubler  . Hypertension   . Allergy   . Chest pain     stress echo 2009 [facility]. MCh 5-19 11  MI ruled out  recuttent chest pain September 28 2009/catherization  October 06 2009 - mild to moderate nonobstructive disease...current pain is not cardiac.. normal LV CAD .Marland Kitchenmild nonobstructive  ..catherization June 2011.   Marland Kitchen Pneumonia     -06-2008   Past Surgical History  Procedure Laterality Date  . Cardiac catherization      10-06-09 Dr Myrtis Ser showed mild nonobstructive disease   Family History  Problem Relation Age of Onset  . Lung cancer Father     was a smoker/work in coal mines  . Asthma Paternal Grandmother   . Heart disease Father   . Heart disease Mother    History  Substance Use Topics  . Smoking status: Former Smoker -- 4.00 packs/day for 22 years    Types: Cigarettes    Start date: 04/15/1974    Quit date: 04/15/1996  . Smokeless tobacco: Never Used  . Alcohol Use: No    Review of Systems  Constitutional: Negative  for fever and chills.  Respiratory: Positive for cough and wheezing. Negative for shortness of breath.   Cardiovascular: Negative for chest pain.  Gastrointestinal: Negative for nausea, vomiting, diarrhea and constipation.  Genitourinary: Negative for dysuria.  All other systems reviewed and are negative.     Allergies  Eggs or egg-derived products and Penicillins  Home Medications   Prior to Admission medications   Medication Sig Start Date End Date Taking? Authorizing Provider  albuterol (PROAIR HFA) 108 (90 BASE) MCG/ACT inhaler INHALE 2 PUFFS INTO THE LUNGS EVERY 4 HOURS AS NEEDED FOR SHORTNESS OF BREATH OR WHEEZING 11/24/13  Yes Nelwyn Salisbury, MD  ANDROGEL PUMP 20.25 MG/ACT (1.62%) GEL APPLY 4 PUMPS USE AS DIRECTED Patient taking differently: APPLY 4 PUMPS ever evening 08/19/14  Yes Nelwyn Salisbury, MD  azelastine (ASTELIN) 0.1 % nasal spray SPRAY ONCE IN EACH NOSTRIL EVERY DAY AS DIRECTED Patient taking differently: 1 spray in each nostril BID 02/02/14  Yes Nelwyn Salisbury, MD  budesonide-formoterol (SYMBICORT) 160-4.5 MCG/ACT inhaler INHALE 2 PUFFS INTO THE LUNGS TWICE A DAY 11/24/13  Yes Nelwyn Salisbury, MD  buPROPion (WELLBUTRIN XL) 150 MG 24 hr tablet TAKE 1 TABLET (150 MG TOTAL) BY MOUTH DAILY. 08/22/14  Yes Nelwyn Salisbury, MD  CRESTOR 10 MG tablet TAKE 1 TABLET EVERY DAY   Yes Nelwyn Salisbury, MD  furosemide (LASIX) 20 MG tablet Take 20 mg  by mouth daily as needed for fluid.   Yes Historical Provider, MD  lamoTRIgine (LAMICTAL) 25 MG tablet Take 2 tablets (50 mg total) by mouth daily. 02/03/14  Yes Nelwyn Salisbury, MD  losartan (COZAAR) 100 MG tablet TAKE 1 TABLET BY MOUTH EVERY DAY 01/27/14  Yes Nelwyn Salisbury, MD  montelukast (SINGULAIR) 10 MG tablet TAKE 1 TABLET BY MOUTH AT BEDTIME 03/08/14  Yes Nelwyn Salisbury, MD  Multiple Vitamin (MULTIVITAMIN) capsule Take 1 capsule by mouth daily.   Yes Historical Provider, MD  omeprazole (PRILOSEC) 40 MG capsule TAKE ONE CAPSULE BY MOUTH EVERY DAY  01/27/14  Yes Nelwyn Salisbury, MD  CIALIS 5 MG tablet TAKE 1 TABLET BY MOUTH EVERY DAY AS NEEDED FOR ED Patient not taking: Reported on 09/25/2014 02/02/14   Nelwyn Salisbury, MD  oxyCODONE-acetaminophen (PERCOCET/ROXICET) 5-325 MG per tablet Take 1 tablet by mouth every 6 (six) hours as needed for moderate pain or severe pain. Patient not taking: Reported on 09/25/2014 11/22/13   Shon Baton, MD   BP 139/69 mmHg  Pulse 98  Temp(Src) 99.2 F (37.3 C) (Oral)  Resp 13  SpO2 92% Physical Exam  Constitutional: He is oriented to person, place, and time. He appears well-developed and well-nourished.  HENT:  Head: Normocephalic and atraumatic.  Eyes: Conjunctivae and EOM are normal. Pupils are equal, round, and reactive to light. Right eye exhibits no discharge. Left eye exhibits no discharge. No scleral icterus.  Neck: Normal range of motion. Neck supple. No JVD present.  Cardiovascular: Normal rate, regular rhythm and normal heart sounds.  Exam reveals no gallop and no friction rub.   No murmur heard. Pulmonary/Chest: Effort normal. No respiratory distress. He has wheezes. He has no rales. He exhibits no tenderness.  Bilateral wheezes  Abdominal: Soft. He exhibits no distension and no mass. There is no tenderness. There is no rebound and no guarding.  Musculoskeletal: Normal range of motion. He exhibits no edema or tenderness.  Neurological: He is alert and oriented to person, place, and time.  Skin: Skin is warm and dry.  Psychiatric: He has a normal mood and affect. His behavior is normal. Judgment and thought content normal.  Nursing note and vitals reviewed.   ED Course  Procedures (including critical care time) Labs Review Labs Reviewed  CBC WITH DIFFERENTIAL/PLATELET  BASIC METABOLIC PANEL    Imaging Review No results found.   EKG Interpretation   Date/Time:  Sunday September 25 2014 18:08:58 EDT Ventricular Rate:  98 PR Interval:  139 QRS Duration: 94 QT Interval:  355 QTC  Calculation: 453 R Axis:   -25 Text Interpretation:  Sinus rhythm Borderline left axis deviation RSR' in  V1 or V2, probably normal variant since last tracing no significant change  Abnormal ekg Confirmed by MILLER  MD, BRIAN ([id number]) on 09/25/2014 6:12:26  PM      MDM   Final diagnoses:  Cough  Asthma exacerbation    Patient with cough and wheezing. History of asthma. Will give breathing treatment, check chest x-ray, and labs. Will reassess. Patient given Solu-Medrol by [facility] physicians.  Patient still wheezing and having increased work of breathing after 2 nebs.  Will given continuous neb.  Patient still wheezing and SOB after CAT.  Discussed with Dr. Jodi Mourning.  Will give magnesium.  Consider bipap.  Patient will need to be admitted.  Patient seen by and discussed with Dr. Jodi Mourning, who agrees that based on increased work of breathing, patient will need to  be admitted.  Medications  albuterol (PROVENTIL,VENTOLIN) solution continuous neb (0 mg/hr Nebulization Stopped 09/25/14 2122)  magnesium sulfate IVPB 2 g 50 mL (not administered)  ipratropium (ATROVENT) nebulizer solution 0.5 mg (0.5 mg Nebulization Given 09/25/14 1922)  albuterol (PROVENTIL) (2.5 MG/3ML) 0.083% nebulizer solution 5 mg (5 mg Nebulization Given 09/25/14 1922)     CRITICAL CARE Performed by: Roxy Horseman   Total critical care time: 35  Critical care time was exclusive of separately billable procedures and treating other patients.  Critical care was necessary to treat or prevent imminent or life-threatening deterioration.  Critical care was time spent personally by me on the following activities: development of treatment plan with patient and/or surrogate as well as nursing, discussions with consultants, evaluation of patient's response to treatment, examination of patient, obtaining history from patient or surrogate, ordering and performing treatments and interventions, ordering and review of laboratory  studies, ordering and review of radiographic studies, pulse oximetry and re-evaluation of patient's condition.     Roxy Horseman, PA-C 09/25/14 2258  Blane Ohara, MD 09/25/14 2330

## 2014-09-25 NOTE — ED Notes (Addendum)
Coming from UC, c/o weakness, congestion, and cough x 4 days and wheezing. For eval of asthma exacerbation and  possible pna. Given albuterol neb and solumedrol IM

## 2014-09-25 NOTE — ED Notes (Signed)
Pt was able to maintain O2 sats while ambulating in the hallway. However, pts respiratory rate increased to about 36/min. PA Rob made aware as well as the Charity fundraiser.

## 2014-09-25 NOTE — Progress Notes (Signed)
Pt is in respiratory distress and unable to do peak flow at this time.

## 2014-09-26 ENCOUNTER — Encounter (HOSPITAL_COMMUNITY): Payer: Self-pay

## 2014-09-26 ENCOUNTER — Observation Stay (HOSPITAL_BASED_OUTPATIENT_CLINIC_OR_DEPARTMENT_OTHER): Payer: Commercial Managed Care - PPO

## 2014-09-26 DIAGNOSIS — R0600 Dyspnea, unspecified: Secondary | ICD-10-CM

## 2014-09-26 DIAGNOSIS — J45901 Unspecified asthma with (acute) exacerbation: Secondary | ICD-10-CM | POA: Diagnosis not present

## 2014-09-26 LAB — TROPONIN I
Troponin I: <0.03 ng/mL (ref <0.031)
Troponin I: <0.03 ng/mL (ref <0.031)
Troponin I: <0.03 ng/mL (ref <0.031)

## 2014-09-26 LAB — D-DIMER, QUANTITATIVE: D-Dimer, Quant: 0.31 ug/mL-FEU (ref 0.00–0.48)

## 2014-09-26 MED ORDER — MOMETASONE FURO-FORMOTEROL FUM 100-5 MCG/ACT IN AERO
2.0000 | INHALATION_SPRAY | Freq: Two times a day (BID) | RESPIRATORY_TRACT | Status: DC
  Administered 2014-09-26 – 2014-09-28 (×6): 2 via RESPIRATORY_TRACT
  Filled 2014-09-26: qty 8.8

## 2014-09-26 MED ORDER — AZELASTINE HCL 0.1 % NA SOLN
1.0000 | Freq: Two times a day (BID) | NASAL | Status: DC
  Administered 2014-09-26 – 2014-09-28 (×6): 1 via NASAL
  Filled 2014-09-26: qty 30

## 2014-09-26 MED ORDER — ROSUVASTATIN CALCIUM 10 MG PO TABS
10.0000 mg | ORAL_TABLET | Freq: Every day | ORAL | Status: DC
  Administered 2014-09-26 – 2014-09-27 (×3): 10 mg via ORAL
  Filled 2014-09-26 (×4): qty 1

## 2014-09-26 MED ORDER — ACETAMINOPHEN 325 MG PO TABS: 650.0000 mg | ORAL_TABLET | Freq: Four times a day (QID) | ORAL | Status: DC | PRN

## 2014-09-26 MED ORDER — SODIUM CHLORIDE 0.9 % IJ SOLN
3.0000 mL | Freq: Two times a day (BID) | INTRAMUSCULAR | Status: DC
Start: 2014-09-26 — End: 2014-09-28
  Administered 2014-09-26 – 2014-09-28 (×3): 3 mL via INTRAVENOUS

## 2014-09-26 MED ORDER — ENOXAPARIN SODIUM 40 MG/0.4ML ~~LOC~~ SOLN
40.0000 mg | SUBCUTANEOUS | Status: DC
  Administered 2014-09-26 – 2014-09-27 (×2): 40 mg via SUBCUTANEOUS
  Filled 2014-09-26 (×3): qty 0.4

## 2014-09-26 MED ORDER — BUPROPION HCL ER (XL) 150 MG PO TB24
150.0000 mg | ORAL_TABLET | Freq: Every day | ORAL | Status: DC
  Administered 2014-09-26 – 2014-09-28 (×3): 150 mg via ORAL
  Filled 2014-09-26 (×3): qty 1

## 2014-09-26 MED ORDER — METHYLPREDNISOLONE SODIUM SUCC 125 MG IJ SOLR
80.0000 mg | Freq: Three times a day (TID) | INTRAMUSCULAR | Status: DC
  Administered 2014-09-26 – 2014-09-27 (×6): 80 mg via INTRAVENOUS
  Filled 2014-09-26 (×8): qty 1.28

## 2014-09-26 MED ORDER — LOSARTAN POTASSIUM 50 MG PO TABS
100.0000 mg | ORAL_TABLET | Freq: Every day | ORAL | Status: DC
  Administered 2014-09-26 – 2014-09-28 (×3): 100 mg via ORAL
  Filled 2014-09-26 (×3): qty 2

## 2014-09-26 MED ORDER — MONTELUKAST SODIUM 10 MG PO TABS
10.0000 mg | ORAL_TABLET | Freq: Every day | ORAL | Status: DC
  Administered 2014-09-26 – 2014-09-27 (×3): 10 mg via ORAL
  Filled 2014-09-26 (×5): qty 1

## 2014-09-26 MED ORDER — IPRATROPIUM BROMIDE 0.02 % IN SOLN
0.5000 mg | Freq: Four times a day (QID) | RESPIRATORY_TRACT | Status: DC
  Administered 2014-09-26: 0.5 mg via RESPIRATORY_TRACT
  Filled 2014-09-26: qty 2.5

## 2014-09-26 MED ORDER — ACETAMINOPHEN 650 MG RE SUPP: 650.0000 mg | Freq: Four times a day (QID) | RECTAL | Status: DC | PRN

## 2014-09-26 MED ORDER — ALBUTEROL SULFATE (2.5 MG/3ML) 0.083% IN NEBU: 2.5000 mg | INHALATION_SOLUTION | Freq: Four times a day (QID) | RESPIRATORY_TRACT | Status: DC | PRN

## 2014-09-26 MED ORDER — FUROSEMIDE 20 MG PO TABS
20.0000 mg | ORAL_TABLET | Freq: Every day | ORAL | Status: DC | PRN
  Filled 2014-09-26: qty 1

## 2014-09-26 MED ORDER — SODIUM CHLORIDE 0.9 % IV SOLN
INTRAVENOUS | Status: AC
  Administered 2014-09-26: 01:00:00 via INTRAVENOUS

## 2014-09-26 MED ORDER — ALBUTEROL SULFATE HFA 108 (90 BASE) MCG/ACT IN AERS: 2.0000 | INHALATION_SPRAY | RESPIRATORY_TRACT | Status: DC | PRN

## 2014-09-26 MED ORDER — ALBUTEROL SULFATE (2.5 MG/3ML) 0.083% IN NEBU
2.5000 mg | INHALATION_SOLUTION | Freq: Four times a day (QID) | RESPIRATORY_TRACT | Status: DC
  Administered 2014-09-26 (×4): 2.5 mg via RESPIRATORY_TRACT
  Filled 2014-09-26 (×4): qty 3

## 2014-09-26 MED ORDER — PANTOPRAZOLE SODIUM 40 MG PO TBEC
40.0000 mg | DELAYED_RELEASE_TABLET | Freq: Every day | ORAL | Status: DC
  Administered 2014-09-26 – 2014-09-27 (×3): 40 mg via ORAL
  Filled 2014-09-26 (×4): qty 1

## 2014-09-26 MED ORDER — IPRATROPIUM-ALBUTEROL 0.5-2.5 (3) MG/3ML IN SOLN
3.0000 mL | Freq: Four times a day (QID) | RESPIRATORY_TRACT | Status: DC
  Administered 2014-09-27 – 2014-09-28 (×6): 3 mL via RESPIRATORY_TRACT
  Filled 2014-09-26 (×6): qty 3

## 2014-09-26 MED ORDER — LAMOTRIGINE 25 MG PO TABS
50.0000 mg | ORAL_TABLET | Freq: Every day | ORAL | Status: DC
  Administered 2014-09-26 – 2014-09-27 (×3): 50 mg via ORAL
  Filled 2014-09-26 (×4): qty 2

## 2014-09-26 NOTE — Progress Notes (Signed)
  Echocardiogram 2D Echocardiogram has been performed.  Andrew Swanson 09/26/2014, 11:08 AM

## 2014-09-26 NOTE — Progress Notes (Signed)
[facility] PROGRESS NOTE  Andrew Swanson [id number] DOB: Aug 16, 1960 DOA: 09/25/2014 PCP: Nelwyn Salisbury, MD  Assessment/Plan: 1. Asthma exacerbation: Admitted to telemetry, started on IV steroids, and duonebs. And levaquin.  Good saturations on RA.    Hypertension: Controlled.     Code Status: full code.  Family Communication: family at bedside.  Disposition Plan: pending.    Consultants:  none Procedures:  echo  Antibiotics:  levaquin  HPI/Subjective: Still has wheezing.   Objective: Filed Vitals:   09/26/14 1535  BP: 138/75  Pulse: 81  Temp: 97.6 F (36.4 C)  Resp: 20    Intake/Output Summary (Last 24 hours) at 09/26/14 1748 Last data filed at 09/26/14 1310  Gross per 24 hour  Intake    480 ml  Output      0 ml  Net    480 ml   Filed Weights   09/25/14 2238 09/26/14 0612  Weight: 95.255 kg (210 lb) 90.992 kg (200 lb 9.6 oz)    Exam:   General:  Alert afebrile comfortable, sitting on bed.   Cardiovascular: s1s2  Respiratory: bilateral wheezing heard.   Abdomen: soft non tender non distended bowel sounds heard.   Musculoskeletal: no pedal edema.   Data Reviewed: Basic Metabolic Panel:  Recent Labs Lab 09/25/14 1910  NA 137  K 3.6  CL 103  CO2 20*  GLUCOSE 123*  BUN 19  CREATININE 0.99  CALCIUM 8.5*   Liver Function Tests: No results for input(s): AST, ALT, ALKPHOS, BILITOT, PROT, ALBUMIN in the last 168 hours. No results for input(s): LIPASE, AMYLASE in the last 168 hours. No results for input(s): AMMONIA in the last 168 hours. CBC:  Recent Labs Lab 09/25/14 1910  WBC 11.3*  NEUTROABS 10.0*  HGB 13.9  HCT 41.9  MCV 91.9  PLT 309   Cardiac Enzymes:  Recent Labs Lab 09/25/14 2321 09/26/14 0456 09/26/14 1055  TROPONINI <0.03 <0.03 <0.03   BNP (last 3 results) No results for input(s): BNP in the last 8760 hours.  ProBNP (last 3 results) No results for input(s): PROBNP in the last 8760  hours.  CBG: No results for input(s): GLUCAP in the last 168 hours.  No results found for this or any previous visit (from the past 240 hour(s)).   Studies: Dg Chest 2 View  09/25/2014   CLINICAL DATA:  Initial evaluation for productive cough, shortness of breath, chest pain history of asthma, pneumonia, hypertension.  EXAM: CHEST  2 VIEW  COMPARISON:  Prior study from 11/22/2013  FINDINGS: The cardiac and mediastinal silhouettes are stable in size and contour, and remain within normal limits.  The lungs are normally inflated. No airspace consolidation, pleural effusion, or pulmonary edema is identified. There is no pneumothorax. Mildly irregular biapical pleural thickening noted.  No acute osseous abnormality identified.  IMPRESSION: No active cardiopulmonary disease.   Electronically Signed   By: Rise Mu M.D.   On: 09/25/2014 19:21    Scheduled Meds: . albuterol  2.5 mg Nebulization Q6H  . azelastine  1 spray Each Nare BID  . buPROPion  150 mg Oral Daily  . enoxaparin (LOVENOX) injection  40 mg Subcutaneous Q24H  . lamoTRIgine  50 mg Oral QHS  . levofloxacin (LEVAQUIN) IV  500 mg Intravenous Q24H  . losartan  100 mg Oral Daily  . methylPREDNISolone (SOLU-MEDROL) injection  80 mg Intravenous 3 times per day  . mometasone-formoterol  2 puff Inhalation BID  . montelukast  10 mg Oral QHS  .  pantoprazole  40 mg Oral QHS  . rosuvastatin  10 mg Oral QHS  . sodium chloride  3 mL Intravenous Q12H   Continuous Infusions:   Active Problems:   Asthma exacerbation    Time spent: 25 minutes.     [facility]  [facility] Pager [phone] If 7PM-7AM, please contact night-coverage at [email or web address], password [facility] 09/26/2014, 5:48 PM

## 2014-09-27 DIAGNOSIS — J45901 Unspecified asthma with (acute) exacerbation: Secondary | ICD-10-CM | POA: Diagnosis not present

## 2014-09-27 LAB — HEMOGLOBIN A1C
HEMOGLOBIN A1C: 5.9 % — AB (ref 4.8–5.6)
MEAN PLASMA GLUCOSE: 123 mg/dL

## 2014-09-27 MED ORDER — PREDNISONE 50 MG PO TABS
60.0000 mg | ORAL_TABLET | Freq: Every day | ORAL | Status: DC
  Administered 2014-09-28: 60 mg via ORAL
  Filled 2014-09-27 (×2): qty 1

## 2014-09-27 MED ORDER — LEVOFLOXACIN 500 MG PO TABS
500.0000 mg | ORAL_TABLET | Freq: Every day | ORAL | Status: DC
  Administered 2014-09-27: 500 mg via ORAL
  Filled 2014-09-27 (×2): qty 1

## 2014-09-27 NOTE — Progress Notes (Signed)
[facility] PROGRESS NOTE  Andrew Swanson KJZ:[id number] DOB: 1961-04-15 DOA: 09/25/2014 PCP: Nelwyn Salisbury, MD Interim summary: 54 yo male admitted for sob and cough, found to have asthma exacerbation. Echocardiogram did not show any significant abnormalities.  Assessment/Plan: 1. Asthma exacerbation: Admitted to telemetry, started on IV steroids, and duonebs. And levaquin. His wheezing has improved and he will be transitioned to po steroids in am,. Asked him to ambulate int he hallway.  Good saturations on RA.    Hypertension: Controlled.     Code Status: full code.  Family Communication: none  at bedside.  Disposition Plan: pending.    Consultants:  none Procedures:  echo  Antibiotics:  levaquin  HPI/Subjective: Breathing Improved.   Objective: Filed Vitals:   09/27/14 1426  BP: 117/71  Pulse: 77  Temp: 97.6 F (36.4 C)  Resp: 20    Intake/Output Summary (Last 24 hours) at 09/27/14 1853 Last data filed at 09/27/14 1823  Gross per 24 hour  Intake    480 ml  Output      0 ml  Net    480 ml   Filed Weights   09/25/14 2238 09/26/14 0612 09/27/14 0519  Weight: 95.255 kg (210 lb) 90.992 kg (200 lb 9.6 oz) 91.309 kg (201 lb 4.8 oz)    Exam:   General:  Alert afebrile comfortable, sitting in the chair  Cardiovascular: s1s2  Respiratory: scattered wheezing. Fair air entry.   Abdomen: soft non tender non distended bowel sounds heard.   Musculoskeletal: no pedal edema.   Data Reviewed: Basic Metabolic Panel:  Recent Labs Lab 09/25/14 1910  NA 137  K 3.6  CL 103  CO2 20*  GLUCOSE 123*  BUN 19  CREATININE 0.99  CALCIUM 8.5*   Liver Function Tests: No results for input(s): AST, ALT, ALKPHOS, BILITOT, PROT, ALBUMIN in the last 168 hours. No results for input(s): LIPASE, AMYLASE in the last 168 hours. No results for input(s): AMMONIA in the last 168 hours. CBC:  Recent Labs Lab 09/25/14 1910  WBC 11.3*  NEUTROABS 10.0*  HGB  13.9  HCT 41.9  MCV 91.9  PLT 309   Cardiac Enzymes:  Recent Labs Lab 09/25/14 2321 09/26/14 0456 09/26/14 1055  TROPONINI <0.03 <0.03 <0.03   BNP (last 3 results) No results for input(s): BNP in the last 8760 hours.  ProBNP (last 3 results) No results for input(s): PROBNP in the last 8760 hours.  CBG: No results for input(s): GLUCAP in the last 168 hours.  No results found for this or any previous visit (from the past 240 hour(s)).   Studies: Dg Chest 2 View  09/25/2014   CLINICAL DATA:  Initial evaluation for productive cough, shortness of breath, chest pain history of asthma, pneumonia, hypertension.  EXAM: CHEST  2 VIEW  COMPARISON:  Prior study from 11/22/2013  FINDINGS: The cardiac and mediastinal silhouettes are stable in size and contour, and remain within normal limits.  The lungs are normally inflated. No airspace consolidation, pleural effusion, or pulmonary edema is identified. There is no pneumothorax. Mildly irregular biapical pleural thickening noted.  No acute osseous abnormality identified.  IMPRESSION: No active cardiopulmonary disease.   Electronically Signed   By: Rise Mu M.D.   On: 09/25/2014 19:21    Scheduled Meds: . azelastine  1 spray Each Nare BID  . buPROPion  150 mg Oral Daily  . enoxaparin (LOVENOX) injection  40 mg Subcutaneous Q24H  . ipratropium-albuterol  3 mL Nebulization Q6H  . lamoTRIgine  50 mg Oral QHS  . levofloxacin  500 mg Oral QHS  . losartan  100 mg Oral Daily  . mometasone-formoterol  2 puff Inhalation BID  . montelukast  10 mg Oral QHS  . pantoprazole  40 mg Oral QHS  . [START ON 09/28/2014] predniSONE  60 mg Oral Q breakfast  . rosuvastatin  10 mg Oral QHS  . sodium chloride  3 mL Intravenous Q12H   Continuous Infusions:   Active Problems:   Asthma exacerbation    Time spent: 25 minutes.     [facility]  [facility] Pager [phone] If 7PM-7AM, please contact night-coverage at [email or web address],  password [facility] 09/27/2014, 6:53 PM

## 2014-09-27 NOTE — Progress Notes (Signed)
PHARMACIST - PHYSICIAN COMMUNICATION DR:   Blake Divine CONCERNING: Antibiotic IV to Oral Route Change Policy  RECOMMENDATION: This patient is receiving Levaquin by the intravenous route.  Based on criteria approved by the Pharmacy and Therapeutics Committee, the antibiotic(s) is/are being converted to the equivalent oral dose form(s).   DESCRIPTION: These criteria include:  Patient being treated for a respiratory tract infection, urinary tract infection, cellulitis or clostridium difficile associated diarrhea if on metronidazole  The patient is not neutropenic and does not exhibit a GI malabsorption state  The patient is eating (either orally or via tube) and/or has been taking other orally administered medications for a least 24 hours  The patient is improving clinically and has a Tmax < 100.5  If you have questions about this conversion, please contact the Pharmacy Department  []   [phone] )  Andrew Swanson []   [phone] )  [facility] []   [phone] )  Andrew Swanson []   [phone] )  [facility] [x]   [phone] )  [facility]   Andrew Swanson, PharmD, BCPS 09/27/2014 9:07 AM

## 2014-09-28 ENCOUNTER — Other Ambulatory Visit: Payer: Self-pay | Admitting: Family Medicine

## 2014-09-28 DIAGNOSIS — R000 Tachycardia, unspecified: Secondary | ICD-10-CM | POA: Diagnosis present

## 2014-09-28 DIAGNOSIS — F319 Bipolar disorder, unspecified: Secondary | ICD-10-CM

## 2014-09-28 DIAGNOSIS — I10 Essential (primary) hypertension: Secondary | ICD-10-CM | POA: Diagnosis not present

## 2014-09-28 DIAGNOSIS — J45901 Unspecified asthma with (acute) exacerbation: Secondary | ICD-10-CM | POA: Diagnosis not present

## 2014-09-28 LAB — BASIC METABOLIC PANEL
Anion gap: 10 (ref 5–15)
BUN: 29 mg/dL — ABNORMAL HIGH (ref 6–20)
CO2: 23 mmol/L (ref 22–32)
CREATININE: 0.93 mg/dL (ref 0.61–1.24)
Calcium: 8.6 mg/dL — ABNORMAL LOW (ref 8.9–10.3)
Chloride: 107 mmol/L (ref 101–111)
GFR calc Af Amer: >60 mL/min (ref >60)
GFR calc non Af Amer: >60 mL/min (ref >60)
GLUCOSE: 124 mg/dL — AB (ref 65–99)
POTASSIUM: 3.7 mmol/L (ref 3.5–5.1)
Sodium: 140 mmol/L (ref 135–145)

## 2014-09-28 LAB — MAGNESIUM: MAGNESIUM: 2.4 mg/dL (ref 1.7–2.4)

## 2014-09-28 MED ORDER — IPRATROPIUM-ALBUTEROL 0.5-2.5 (3) MG/3ML IN SOLN: 3.0000 mL | Freq: Four times a day (QID) | RESPIRATORY_TRACT | Status: DC | PRN

## 2014-09-28 MED ORDER — LEVOFLOXACIN 500 MG PO TABS: 500.0000 mg | ORAL_TABLET | Freq: Every day | ORAL | Status: DC

## 2014-09-28 MED ORDER — PREDNISONE 20 MG PO TABS: 60.0000 mg | ORAL_TABLET | Freq: Every day | ORAL | Status: DC

## 2014-09-28 NOTE — Progress Notes (Signed)
DC instructions reviewed with patient and his spouse. Home med rec carefully reviewed. 3Rx's given. F/u in place. Patient denies further questions or concerns at this time. No changes noted since am assessment. Patient to DC to home after home neb machine arrives.

## 2014-09-28 NOTE — Care Management Note (Signed)
Case Management Note  Patient Details  Name: Andrew Swanson MRN: [id number] Date of Birth: [birth year]  Subjective/Objective:       54 yo male admitted with asthma exacerbation             Action/Plan: 54 yo male from home. Neb machine ordered and lecretia with [facility] made aware of order and that patient would like to know if there is a copay when the ned machine is delivered to the room.  Expected Discharge Date:                  Expected Discharge Plan:  Home/Self Care  In-House Referral:     Discharge planning Services  CM Consult  Post Acute Care Choice:  Durable Medical Equipment Choice offered to:  Patient  DME Arranged:  Nebulizer machine DME Agency:  [facility]  HH Arranged:    HH Agency:     Status of Service:  Completed, signed off  Medicare Important Message Given:  No Date Medicare IM Given:    Medicare IM give by:    Date Additional Medicare IM Given:    Additional Medicare Important Message give by:     If discussed at Long Length of Stay Meetings, dates discussed:    Additional Comments:  Gerrit Halls, RN 09/28/2014, 10:42 AM

## 2014-09-28 NOTE — Discharge Summary (Signed)
Physician Discharge Summary  Andrew Swanson:[id number] DOB: 1961/01/05 DOA: 09/25/2014  PCP: Nelwyn Salisbury, MD  Admit date: 09/25/2014 Discharge date: 09/28/2014  Time spent: 65 minutes  Recommendations for Outpatient Follow-up:  1. Follow-up with Andrew,STEPHEN A, MD in 1 week. On follow-up patient will need Swanson basic metabolic profile done to follow-up on electrolytes and renal function.  Discharge Diagnoses:  Principal Problem:   Asthma exacerbation Active Problems:   Essential hypertension   GERD   Bipolar disorder   Tachycardia   Discharge Condition: Stable and improved  Diet recommendation: Regular  Filed Weights   09/26/14 0612 09/27/14 0519 09/28/14 0747  Weight: 90.992 kg (200 lb 9.6 oz) 91.309 kg (201 lb 4.8 oz) 95.482 kg (210 lb 8 oz)    History of present illness:  Per Dr Andrew Swanson 54 yo male with Asthma apparently c/o sob, Cough since yesterday. + brown sputum one day prior to admission. Pt was more sob on the day of admission and went to urgent care and was given some albuterol and shot im of solumedrol and sent to ED for evaluation. ? Subjective fever. Pt denied cp, palp, n/v, diarrhea, medication noncompliance. Pt had CXR ? Can't see result but per pt negative today. Pt was admitted for asthma exacerbation  Hospital Course:  #1 acute asthma exacerbation Patient was admitted with acute asthma exacerbation area chest x-ray which was done was negative for any acute infiltrate. Patient was admitted placed on IV steroids, IV Levaquin, nebulizer treatments as well as Dulera. Patient improved clinically and was subsequently transitioned from IV steroids to oral steroids. IV Levaquin was subsequently transitioned to oral Levaquin. Patient will be discharged home in stable and improved condition on 4 more days of oral Levaquin in the steroids taper. Patient is to follow-up with PCP as outpatient.  #2 tachycardia Likely secondary to problem #1. Cardiac enzymes were cycled  which were negative 3. 2-D echo was obtained with Swanson normal EF with no wall motion abnormalities. D-dimer obtained was 0.31. Patient's tachycardia improved and had resolved by day of discharge.  #3 hypertension  remained stable throughout the hospitalization. Patient was maintained on his home regimen of antihypertensives medications.  #4 bipolar disorder Remained stable throughout the hospitalization.  The rest of patient's chronic medical issues remained stable throughout the hospitalization and patient be discharged in stable and improved condition.  Procedures:  Chest x-ray 09/25/2014  2-D echo 09/26/2014  Consultations:  None  Discharge Exam: Filed Vitals:   09/28/14 0620  BP: 130/80  Pulse: 63  Temp: 97.4 F (36.3 C)  Resp: 20    General: NAD Cardiovascular: RRR Respiratory: Minimal expiratory wheezing.  Discharge Instructions   Discharge Instructions    Diet general    Complete by:  As directed      Discharge instructions    Complete by:  As directed   Follow up with Andrew,STEPHEN A, MD in 1 week.     Increase activity slowly    Complete by:  As directed           Current Discharge Medication List    START taking these medications   Details  ipratropium-albuterol (DUONEB) 0.5-2.5 (3) MG/3ML SOLN Take 3 mLs by nebulization every 6 (six) hours as needed. Use 3 times daily x 2 days, then use every 6 hours as needed. Qty: 360 mL, Refills: 0    levofloxacin (LEVAQUIN) 500 MG tablet Take 1 tablet (500 mg total) by mouth at bedtime. Take for 4 days then stop. Qty:  4 tablet, Refills: 0    predniSONE (DELTASONE) 20 MG tablet Take 3 tablets (60 mg total) by mouth daily with breakfast. Take 3 tablets (60mg ) daily x 3 days, then 2 tablets (40mg ) daily x 3 days, then 1 tablet (20mg ) daily x 3 days then stop. Qty: 18 tablet, Refills: 0      CONTINUE these medications which have NOT CHANGED   Details  albuterol (PROAIR HFA) 108 (90 BASE) MCG/ACT inhaler INHALE 2  PUFFS INTO THE LUNGS EVERY 4 HOURS AS NEEDED FOR SHORTNESS OF BREATH OR WHEEZING Qty: 8.5 each, Refills: 11    ANDROGEL PUMP 20.25 MG/ACT (1.62%) GEL APPLY 4 PUMPS USE AS DIRECTED Qty: 150 g, Refills: 5    azelastine (ASTELIN) 0.1 % nasal spray SPRAY ONCE IN EACH NOSTRIL EVERY DAY AS DIRECTED Qty: 30 mL, Refills: 11    budesonide-formoterol (SYMBICORT) 160-4.5 MCG/ACT inhaler INHALE 2 PUFFS INTO THE LUNGS TWICE Swanson DAY Qty: 10.2 g, Refills: 11    buPROPion (WELLBUTRIN XL) 150 MG 24 hr tablet TAKE 1 TABLET (150 MG TOTAL) BY MOUTH DAILY. Qty: 30 tablet, Refills: 1    CRESTOR 10 MG tablet TAKE 1 TABLET EVERY DAY Qty: 90 tablet, Refills: 0    furosemide (LASIX) 20 MG tablet Take 20 mg by mouth daily as needed for fluid.    lamoTRIgine (LAMICTAL) 25 MG tablet Take 2 tablets (50 mg total) by mouth daily. Qty: 60 tablet, Refills: 11    losartan (COZAAR) 100 MG tablet TAKE 1 TABLET BY MOUTH EVERY DAY Qty: 30 tablet, Refills: 3    montelukast (SINGULAIR) 10 MG tablet TAKE 1 TABLET BY MOUTH AT BEDTIME Qty: 90 tablet, Refills: 3    Multiple Vitamin (MULTIVITAMIN) capsule Take 1 capsule by mouth daily.    omeprazole (PRILOSEC) 40 MG capsule TAKE ONE CAPSULE BY MOUTH EVERY DAY Qty: 30 capsule, Refills: 3    CIALIS 5 MG tablet TAKE 1 TABLET BY MOUTH EVERY DAY AS NEEDED FOR ED Qty: 30 tablet, Refills: 11      STOP taking these medications     oxyCODONE-acetaminophen (PERCOCET/ROXICET) 5-325 MG per tablet        Allergies  Allergen Reactions  . Eggs Or Egg-Derived Products     REACTION: vomitting  . Penicillins     REACTION: unspecified   Follow-up Information    Follow up with Nelwyn Salisbury, MD On 10/05/2014.   Specialty:  Family Medicine   Why:  Please follow up with Dr. Clent Ridges on Wednesday, June 22nd at 8:30am   Contact information:   [address] [place] [address] [phone]        The results of significant diagnostics from this hospitalization (including  imaging, microbiology, ancillary and laboratory) are listed below for reference.    Significant Diagnostic Studies: Dg Chest 2 View  09/25/2014   CLINICAL DATA:  Initial evaluation for productive cough, shortness of breath, chest pain history of asthma, pneumonia, hypertension.  EXAM: CHEST  2 VIEW  COMPARISON:  Prior study from 11/22/2013  FINDINGS: The cardiac and mediastinal silhouettes are stable in size and contour, and remain within normal limits.  The lungs are normally inflated. No airspace consolidation, pleural effusion, or pulmonary edema is identified. There is no pneumothorax. Mildly irregular biapical pleural thickening noted.  No acute osseous abnormality identified.  IMPRESSION: No active cardiopulmonary disease.   Electronically Signed   By: Rise Mu M.D.   On: 09/25/2014 19:21    Microbiology: No results found for this or  any previous visit (from the past 240 hour(s)).   Labs: Basic Metabolic Panel:  Recent Labs Lab 09/25/14 1910 09/28/14 0830  NA 137 140  K 3.6 3.7  CL 103 107  CO2 20* 23  GLUCOSE 123* 124*  BUN 19 29*  CREATININE 0.99 0.93  CALCIUM 8.5* 8.6*  MG  --  2.4   Liver Function Tests: No results for input(s): AST, ALT, ALKPHOS, BILITOT, PROT, ALBUMIN in the last 168 hours. No results for input(s): LIPASE, AMYLASE in the last 168 hours. No results for input(s): AMMONIA in the last 168 hours. CBC:  Recent Labs Lab 09/25/14 1910  WBC 11.3*  NEUTROABS 10.0*  HGB 13.9  HCT 41.9  MCV 91.9  PLT 309   Cardiac Enzymes:  Recent Labs Lab 09/25/14 2321 09/26/14 0456 09/26/14 1055  TROPONINI <0.03 <0.03 <0.03   BNP: BNP (last 3 results) No results for input(s): BNP in the last 8760 hours.  ProBNP (last 3 results) No results for input(s): PROBNP in the last 8760 hours.  CBG: No results for input(s): GLUCAP in the last 168 hours.     SignedRamiro Harvest MD [facility] 09/28/2014, 11:34 AM

## 2014-09-28 NOTE — Telephone Encounter (Signed)
Can we refill this? 

## 2014-09-28 NOTE — Progress Notes (Signed)
Home neb machine arrived. Patient denies pain and further questions. DC to home with spouse.

## 2014-10-05 ENCOUNTER — Ambulatory Visit (INDEPENDENT_AMBULATORY_CARE_PROVIDER_SITE_OTHER): Payer: Commercial Managed Care - PPO | Admitting: Family Medicine

## 2014-10-05 ENCOUNTER — Telehealth: Payer: Self-pay | Admitting: Family Medicine

## 2014-10-05 ENCOUNTER — Encounter: Payer: Self-pay | Admitting: Family Medicine

## 2014-10-05 VITALS — BP 145/83 | HR 81 | Temp 98.1°F | Ht 68.0 in | Wt 210.0 lb

## 2014-10-05 DIAGNOSIS — J4531 Mild persistent asthma with (acute) exacerbation: Secondary | ICD-10-CM

## 2014-10-05 DIAGNOSIS — J209 Acute bronchitis, unspecified: Secondary | ICD-10-CM | POA: Diagnosis not present

## 2014-10-05 LAB — BASIC METABOLIC PANEL
BUN: 21 mg/dL (ref 6–23)
CALCIUM: 9 mg/dL (ref 8.4–10.5)
CO2: 31 meq/L (ref 19–32)
CREATININE: 1.11 mg/dL (ref 0.40–1.50)
Chloride: 103 mEq/L (ref 96–112)
GFR: 73.46 mL/min (ref >60.00)
Glucose, Bld: 94 mg/dL (ref 70–99)
Potassium: 4.6 mEq/L (ref 3.5–5.1)
SODIUM: 140 meq/L (ref 135–145)

## 2014-10-05 NOTE — Progress Notes (Signed)
Pre visit review using our clinic review tool, if applicable. No additional management support is needed unless otherwise documented below in the visit note. 

## 2014-10-05 NOTE — Progress Notes (Signed)
   Subjective:    Patient ID: Andrew Swanson, male    DOB: 09-16-1960, 54 y.o.   MRN: [id number]  HPI Here to follow up a hospital stay from 09-25-14 to 09-28-14 for bronchitis and an asthma exacerbation. He presented with SOB and a productive cough, but his CXR was clear. He was treated with IV Levaquin, IV steroids, and nebulizer treatments. He improved and the Levaquin and prednisone were changed to oral dosing. He finished the Levaquin 5 days ago and he will finish the prednisone tomorrow. He feels much better but he still has some wheezing and a dry cough. No fever. He is using his inhalers but has not required a nebulizer treatment for the past 3 days. He returned to work yesterday and did well.    Review of Systems  Constitutional: Negative.   HENT: Negative.   Eyes: Negative.   Respiratory: Positive for cough, chest tightness and wheezing. Negative for shortness of breath.   Cardiovascular: Negative.        Objective:   Physical Exam  Constitutional: He appears well-developed and well-nourished. No distress.  HENT:  Right Ear: External ear normal.  Left Ear: External ear normal.  Nose: Nose normal.  Mouth/Throat: Oropharynx is clear and moist.  Eyes: Conjunctivae are normal.  Neck: No thyromegaly present.  Cardiovascular: Normal rate, regular rhythm, normal heart sounds and intact distal pulses.   Pulmonary/Chest: Effort normal. No respiratory distress. He has no rales.  Soft scattered wheezes  Lymphadenopathy:    He has no cervical adenopathy.          Assessment & Plan:  He is recovering from a broncitis which was treated with Levaquin, and he is improving from an asthma exacerbation. He will continue current meds and recheck prn. Check another BMET today since his BUN was elevated in the hospital.

## 2014-10-05 NOTE — Telephone Encounter (Signed)
Letter was received stating as of July 1 patient will need a PA for Androgel but when I called to submit the PA the representative advised me one was not needed.

## 2014-10-20 ENCOUNTER — Other Ambulatory Visit: Payer: Self-pay | Admitting: Family Medicine

## 2014-10-20 ENCOUNTER — Telehealth: Payer: Self-pay | Admitting: Family Medicine

## 2014-10-20 MED ORDER — LOSARTAN POTASSIUM 100 MG PO TABS: 100.0000 mg | ORAL_TABLET | Freq: Every day | ORAL | Status: DC

## 2014-10-20 NOTE — Telephone Encounter (Signed)
Pt refill losartan was denied. Pt saw md on 10-05-14. [facility]

## 2014-10-20 NOTE — Telephone Encounter (Signed)
I spoke with pt and he does currently take this medication every day and I did send script e-scribe.

## 2014-11-15 ENCOUNTER — Encounter: Payer: Self-pay | Admitting: Family Medicine

## 2014-11-27 ENCOUNTER — Other Ambulatory Visit: Payer: Self-pay | Admitting: Family Medicine

## 2014-11-28 ENCOUNTER — Ambulatory Visit (INDEPENDENT_AMBULATORY_CARE_PROVIDER_SITE_OTHER): Payer: Commercial Managed Care - PPO | Admitting: Family Medicine

## 2014-11-28 ENCOUNTER — Encounter: Payer: Self-pay | Admitting: Family Medicine

## 2014-11-28 VITALS — BP 151/88 | HR 77 | Temp 98.3°F | Ht 68.0 in | Wt 206.0 lb

## 2014-11-28 DIAGNOSIS — F319 Bipolar disorder, unspecified: Secondary | ICD-10-CM

## 2014-11-28 DIAGNOSIS — E291 Testicular hypofunction: Secondary | ICD-10-CM

## 2014-11-28 MED ORDER — ROSUVASTATIN CALCIUM 10 MG PO TABS: 10.0000 mg | ORAL_TABLET | Freq: Every day | ORAL | Status: DC

## 2014-11-28 MED ORDER — LAMOTRIGINE 100 MG PO TABS: 100.0000 mg | ORAL_TABLET | Freq: Every day | ORAL | Status: DC

## 2014-11-28 MED ORDER — SYRINGE (DISPOSABLE) 3 ML MISC: 1.0000 | Status: DC

## 2014-11-28 MED ORDER — BUPROPION HCL ER (XL) 300 MG PO TB24: 300.0000 mg | ORAL_TABLET | Freq: Every day | ORAL | Status: DC

## 2014-11-28 MED ORDER — "NEEDLE (DISP) 23G X 1"" MISC": 1.0000 | Status: DC

## 2014-11-28 MED ORDER — TESTOSTERONE CYPIONATE 200 MG/ML IM SOLN: 200.0000 mg | INTRAMUSCULAR | Status: DC

## 2014-11-28 NOTE — Progress Notes (Signed)
   Subjective:    Patient ID: Andrew Swanson, male    DOB: Oct 12, 1960, 54 y.o.   MRN: [id number]  HPI Here to discuss bipolar disorder and low testosterone. His last testosterone level was 280 on 11-24-13 and he has been using Androgel for several years. We have not been able to get his level up to a target range and he feels no better. He feels fatigued and has little libido. He asks to try testosterone injections instead. Also his anxiety has been increased lately though he does not know why. His life is going fairly well. He has been on the current regimen for the past year.    Review of Systems  Respiratory: Negative.   Cardiovascular: Negative.   Neurological: Negative.   Psychiatric/Behavioral: Positive for agitation. Negative for behavioral problems, confusion, dysphoric mood and decreased concentration. The patient is nervous/anxious.        Objective:   Physical Exam  Constitutional: He is oriented to person, place, and time. He appears well-developed and well-nourished.  Cardiovascular: Normal rate, regular rhythm, normal heart sounds and intact distal pulses.   Pulmonary/Chest: Effort normal and breath sounds normal.  Neurological: He is alert and oriented to person, place, and time.  Psychiatric: He has a normal mood and affect. His behavior is normal. Thought content normal.          Assessment & Plan:  For his anxiety we will increase the Wellbutrin XL to 300 mg daily and will increase the Lamictal to 100 mg daily. He will stop Androgel and try testosterone shots weekly. Get a blood level today and recheck in 3 months

## 2014-11-28 NOTE — Progress Notes (Signed)
Pre visit review using our clinic review tool, if applicable. No additional management support is needed unless otherwise documented below in the visit note. 

## 2014-11-29 ENCOUNTER — Encounter: Payer: Self-pay | Admitting: Family Medicine

## 2014-11-29 LAB — TESTOSTERONE: TESTOSTERONE: 328.24 ng/dL (ref 300.00–890.00)

## 2014-11-29 NOTE — Telephone Encounter (Signed)
He was seen OV 

## 2014-12-05 ENCOUNTER — Telehealth: Payer: Self-pay | Admitting: Family Medicine

## 2014-12-05 NOTE — Telephone Encounter (Signed)
Per [facility] pt must have two total Testerone levels below 280 prior to starting Testerone Cyp. They will approve medication without this. All recent labs are within range. Last level below 280 was > two years ago.

## 2014-12-06 NOTE — Telephone Encounter (Signed)
Noted. If he wishes I can refer him to Urology to try something else

## 2014-12-06 NOTE — Telephone Encounter (Signed)
I left a voice message for pt to return my call.  

## 2014-12-07 NOTE — Telephone Encounter (Signed)
I spoke with pt and he is going to pay out of pocket for now and follow up with in 3 months, Dr. Clent Ridges is aware.

## 2014-12-14 ENCOUNTER — Other Ambulatory Visit: Payer: Self-pay | Admitting: Family Medicine

## 2015-02-27 ENCOUNTER — Ambulatory Visit (INDEPENDENT_AMBULATORY_CARE_PROVIDER_SITE_OTHER): Payer: Commercial Managed Care - PPO | Admitting: Family Medicine

## 2015-02-27 ENCOUNTER — Encounter: Payer: Self-pay | Admitting: Family Medicine

## 2015-02-27 VITALS — BP 149/91 | HR 83 | Temp 98.5°F | Ht 68.0 in | Wt 212.0 lb

## 2015-02-27 DIAGNOSIS — N138 Other obstructive and reflux uropathy: Secondary | ICD-10-CM

## 2015-02-27 DIAGNOSIS — E291 Testicular hypofunction: Secondary | ICD-10-CM

## 2015-02-27 DIAGNOSIS — F3170 Bipolar disorder, currently in remission, most recent episode unspecified: Secondary | ICD-10-CM | POA: Diagnosis not present

## 2015-02-27 DIAGNOSIS — N401 Enlarged prostate with lower urinary tract symptoms: Secondary | ICD-10-CM | POA: Diagnosis not present

## 2015-02-27 DIAGNOSIS — I10 Essential (primary) hypertension: Secondary | ICD-10-CM | POA: Diagnosis not present

## 2015-02-27 LAB — LIPID PANEL
CHOLESTEROL: 162 mg/dL (ref 0–200)
HDL: 35.9 mg/dL — ABNORMAL LOW (ref >39.00)
LDL Cholesterol: 107 mg/dL — ABNORMAL HIGH (ref 0–99)
NonHDL: 126.28
TRIGLYCERIDES: 94 mg/dL (ref 0.0–149.0)
Total CHOL/HDL Ratio: 5
VLDL: 18.8 mg/dL (ref 0.0–40.0)

## 2015-02-27 LAB — CBC WITH DIFFERENTIAL/PLATELET
BASOS PCT: 0.6 % (ref 0.0–3.0)
Basophils Absolute: 0.1 10*3/uL (ref 0.0–0.1)
EOS PCT: 4.2 % (ref 0.0–5.0)
Eosinophils Absolute: 0.4 10*3/uL (ref 0.0–0.7)
HEMATOCRIT: 46.8 % (ref 39.0–52.0)
HEMOGLOBIN: 15.4 g/dL (ref 13.0–17.0)
LYMPHS PCT: 16.2 % (ref 12.0–46.0)
Lymphs Abs: 1.4 10*3/uL (ref 0.7–4.0)
MCHC: 33 g/dL (ref 30.0–36.0)
MCV: 89.7 fl (ref 78.0–100.0)
Monocytes Absolute: 0.8 10*3/uL (ref 0.1–1.0)
Monocytes Relative: 9.5 % (ref 3.0–12.0)
NEUTROS ABS: 6 10*3/uL (ref 1.4–7.7)
Neutrophils Relative %: 69.5 % (ref 43.0–77.0)
PLATELETS: 346 10*3/uL (ref 150.0–400.0)
RBC: 5.21 Mil/uL (ref 4.22–5.81)
RDW: 13 % (ref 11.5–15.5)
WBC: 8.6 10*3/uL (ref 4.0–10.5)

## 2015-02-27 LAB — PSA: PSA: 2.58 ng/mL (ref 0.10–4.00)

## 2015-02-27 LAB — BASIC METABOLIC PANEL
BUN: 10 mg/dL (ref 6–23)
CHLORIDE: 107 meq/L (ref 96–112)
CO2: 26 mEq/L (ref 19–32)
Calcium: 9.1 mg/dL (ref 8.4–10.5)
Creatinine, Ser: 0.97 mg/dL (ref 0.40–1.50)
GFR: 85.7 mL/min (ref >60.00)
GLUCOSE: 90 mg/dL (ref 70–99)
POTASSIUM: 4.1 meq/L (ref 3.5–5.1)
SODIUM: 142 meq/L (ref 135–145)

## 2015-02-27 LAB — POCT URINALYSIS DIPSTICK
BILIRUBIN UA: NEGATIVE
GLUCOSE UA: NEGATIVE
Ketones, UA: NEGATIVE
Leukocytes, UA: NEGATIVE
NITRITE UA: NEGATIVE
Protein, UA: NEGATIVE
RBC UA: NEGATIVE
SPEC GRAV UA: 1.025
UROBILINOGEN UA: 1
pH, UA: 5.5

## 2015-02-27 LAB — TSH: TSH: 1.64 u[IU]/mL (ref 0.35–4.50)

## 2015-02-27 LAB — HEPATIC FUNCTION PANEL
ALBUMIN: 3.8 g/dL (ref 3.5–5.2)
ALK PHOS: 65 U/L (ref 39–117)
ALT: 22 U/L (ref 0–53)
AST: 19 U/L (ref 0–37)
BILIRUBIN DIRECT: 0.1 mg/dL (ref 0.0–0.3)
TOTAL PROTEIN: 6 g/dL (ref 6.0–8.3)
Total Bilirubin: 0.4 mg/dL (ref 0.2–1.2)

## 2015-02-27 LAB — TESTOSTERONE: Testosterone: 1260.04 ng/dL — ABNORMAL HIGH (ref 300.00–890.00)

## 2015-02-27 NOTE — Progress Notes (Signed)
Pre visit review using our clinic review tool, if applicable. No additional management support is needed unless otherwise documented below in the visit note. 

## 2015-02-27 NOTE — Progress Notes (Signed)
   Subjective:    Patient ID: Andrew Swanson, male    DOB: 03-Sep-1960, 54 y.o.   MRN: [id number]  HPI Here to follow up. He feels great and has no concerns. At our last visit we increased doses of Wellbutrin and Lamictal, and his moods have been well controlled. We started him on testosterone shots and these have made a big difference for him as far as libido and overall energy. His BP has been stable at home.    Review of Systems  Constitutional: Negative.   Respiratory: Negative.   Cardiovascular: Negative.   Neurological: Negative.   Psychiatric/Behavioral: Negative.        Objective:   Physical Exam  Constitutional: He is oriented to person, place, and time. He appears well-developed and well-nourished.  Neck: No thyromegaly present.  Cardiovascular: Normal rate, regular rhythm, normal heart sounds and intact distal pulses.   Pulmonary/Chest: Effort normal and breath sounds normal.  Lymphadenopathy:    He has no cervical adenopathy.  Neurological: He is alert and oriented to person, place, and time.  Psychiatric: He has a normal mood and affect. His behavior is normal. Thought content normal.          Assessment & Plan:  His HTN is stable, his bipolar disorder is well controlled, his libido is improved. We will get labs today including another testosterone level.

## 2015-03-03 ENCOUNTER — Other Ambulatory Visit: Payer: Self-pay | Admitting: Family Medicine

## 2015-03-18 ENCOUNTER — Other Ambulatory Visit: Payer: Self-pay | Admitting: Family Medicine

## 2015-04-01 ENCOUNTER — Other Ambulatory Visit: Payer: Self-pay | Admitting: Family Medicine

## 2015-04-03 ENCOUNTER — Other Ambulatory Visit: Payer: Self-pay | Admitting: Family Medicine

## 2015-04-03 ENCOUNTER — Encounter: Payer: Self-pay | Admitting: Family Medicine

## 2015-04-03 MED ORDER — MONTELUKAST SODIUM 10 MG PO TABS: 10.0000 mg | ORAL_TABLET | Freq: Every day | ORAL | Status: DC

## 2015-04-03 NOTE — Addendum Note (Signed)
Addended by: Aniceto BossNIMMONS, SYLVIA A on: 04/03/2015 02:02 PM   Modules accepted: Orders

## 2015-04-04 ENCOUNTER — Encounter: Payer: Self-pay | Admitting: Family Medicine

## 2015-04-04 ENCOUNTER — Other Ambulatory Visit: Payer: Self-pay | Admitting: Family Medicine

## 2015-04-04 DIAGNOSIS — N529 Male erectile dysfunction, unspecified: Secondary | ICD-10-CM

## 2015-04-04 MED ORDER — ALBUTEROL SULFATE HFA 108 (90 BASE) MCG/ACT IN AERS: INHALATION_SPRAY | RESPIRATORY_TRACT | Status: DC

## 2015-04-04 NOTE — Telephone Encounter (Signed)
Pt last visit 02/27/15 Pt last Rx refill 02/02/14 #30 with 11 refills

## 2015-04-04 NOTE — Telephone Encounter (Signed)
Referral was done  

## 2015-04-16 ENCOUNTER — Other Ambulatory Visit: Payer: Self-pay | Admitting: Family Medicine

## 2015-05-29 ENCOUNTER — Ambulatory Visit: Payer: Commercial Managed Care - PPO | Admitting: Family Medicine

## 2015-05-29 ENCOUNTER — Ambulatory Visit: Payer: Self-pay | Admitting: Family Medicine

## 2015-06-19 ENCOUNTER — Other Ambulatory Visit: Payer: Self-pay | Admitting: Family Medicine

## 2015-06-20 ENCOUNTER — Other Ambulatory Visit: Payer: Self-pay | Admitting: Family Medicine

## 2015-06-20 NOTE — Telephone Encounter (Signed)
Refill for 6 months. 

## 2015-06-26 ENCOUNTER — Other Ambulatory Visit: Payer: Self-pay | Admitting: Family Medicine

## 2015-06-30 ENCOUNTER — Other Ambulatory Visit: Payer: Self-pay | Admitting: Family Medicine

## 2015-07-14 ENCOUNTER — Ambulatory Visit: Payer: Self-pay | Admitting: Family Medicine

## 2015-07-21 ENCOUNTER — Ambulatory Visit: Payer: Self-pay | Admitting: Family Medicine

## 2015-08-03 ENCOUNTER — Other Ambulatory Visit: Payer: Self-pay | Admitting: Family Medicine

## 2015-11-01 ENCOUNTER — Other Ambulatory Visit: Payer: Self-pay | Admitting: Family Medicine

## 2015-11-02 ENCOUNTER — Other Ambulatory Visit: Payer: Self-pay | Admitting: General Practice

## 2015-11-26 ENCOUNTER — Other Ambulatory Visit: Payer: Self-pay | Admitting: Family Medicine

## 2015-12-21 ENCOUNTER — Other Ambulatory Visit: Payer: Self-pay | Admitting: Family Medicine

## 2015-12-22 ENCOUNTER — Ambulatory Visit: Payer: Self-pay | Admitting: Family Medicine

## 2015-12-22 ENCOUNTER — Telehealth: Payer: Self-pay | Admitting: Family Medicine

## 2015-12-22 MED ORDER — TESTOSTERONE CYPIONATE 200 MG/ML IM SOLN: INTRAMUSCULAR | 1 refills | Status: DC

## 2015-12-22 MED ORDER — "NEEDLE (DISP) 23G X 1"" MISC": 1.0000 | 1 refills | Status: DC

## 2015-12-22 MED ORDER — MONTELUKAST SODIUM 10 MG PO TABS: 10.0000 mg | ORAL_TABLET | Freq: Every day | ORAL | 0 refills | Status: DC

## 2015-12-22 MED ORDER — OMEPRAZOLE 40 MG PO CPDR: 40.0000 mg | DELAYED_RELEASE_CAPSULE | Freq: Every day | ORAL | 2 refills | Status: DC

## 2015-12-22 MED ORDER — LAMOTRIGINE 100 MG PO TABS: 100.0000 mg | ORAL_TABLET | Freq: Every day | ORAL | 0 refills | Status: DC

## 2015-12-22 MED ORDER — ROSUVASTATIN CALCIUM 10 MG PO TABS: 10.0000 mg | ORAL_TABLET | Freq: Every day | ORAL | 0 refills | Status: DC

## 2015-12-22 MED ORDER — BUDESONIDE-FORMOTEROL FUMARATE 160-4.5 MCG/ACT IN AERO: INHALATION_SPRAY | RESPIRATORY_TRACT | 0 refills | Status: DC

## 2015-12-22 MED ORDER — LOSARTAN POTASSIUM 100 MG PO TABS: 100.0000 mg | ORAL_TABLET | Freq: Every day | ORAL | 2 refills | Status: DC

## 2015-12-22 NOTE — Telephone Encounter (Signed)
All sent in except testosterone was printed

## 2015-12-22 NOTE — Telephone Encounter (Signed)
[facility].

## 2015-12-22 NOTE — Telephone Encounter (Signed)
Refill request for 8 medications, see paper copy and send [facility] local [facility].

## 2015-12-25 ENCOUNTER — Other Ambulatory Visit: Payer: Self-pay | Admitting: Family Medicine

## 2015-12-28 ENCOUNTER — Telehealth: Payer: Self-pay

## 2015-12-28 NOTE — Telephone Encounter (Signed)
Received PA request for Testosterone injections. PA submitted & is pending. Key:[id number]

## 2016-01-04 NOTE — Telephone Encounter (Signed)
PA denied because authorization requires the following: you have a diagnosis of symptomatic primary or hypogonadotropic hypogonadism confirmed by two low-for-age morning serum testosterone (total or free) levels defined by the normal laboratory reference value.

## 2016-01-04 NOTE — Telephone Encounter (Signed)
His insurance company will be much more likely to work with him if he sees a Insurance underwriterUrologist for this problem. If he agrees I will refer him to [facility]

## 2016-01-05 ENCOUNTER — Telehealth: Payer: Self-pay | Admitting: Family Medicine

## 2016-01-05 DIAGNOSIS — E291 Testicular hypofunction: Secondary | ICD-10-CM

## 2016-01-05 NOTE — Telephone Encounter (Signed)
Order is in computer, pt will need to schedule a lab appointment and I spoke with pt.

## 2016-01-05 NOTE — Telephone Encounter (Signed)
I spoke with pt and he pays out of pocket for the testosterone, no need for referral. Also is he due for labs to recheck this?

## 2016-01-05 NOTE — Telephone Encounter (Signed)
Can you call pt to schedule this? 

## 2016-01-05 NOTE — Telephone Encounter (Signed)
I left a voice message with this information and sent pt a my chart message.

## 2016-01-05 NOTE — Telephone Encounter (Signed)
Andrew Swanson pt returning your call °

## 2016-01-09 NOTE — Telephone Encounter (Signed)
Lmom for pt to call to schedule lab appt.

## 2016-01-12 NOTE — Telephone Encounter (Signed)
lmom for pt to call to schedule lab appt. °

## 2016-01-14 ENCOUNTER — Encounter: Payer: Self-pay | Admitting: Family Medicine

## 2016-01-16 NOTE — Telephone Encounter (Signed)
Pt scheduled 01/19/16.

## 2016-01-19 ENCOUNTER — Other Ambulatory Visit (INDEPENDENT_AMBULATORY_CARE_PROVIDER_SITE_OTHER): Payer: Commercial Managed Care - PPO

## 2016-01-19 DIAGNOSIS — E291 Testicular hypofunction: Secondary | ICD-10-CM | POA: Diagnosis not present

## 2016-01-20 LAB — TESTOSTERONE: Testosterone: 696 ng/dL (ref 250–827)

## 2016-01-21 ENCOUNTER — Other Ambulatory Visit: Payer: Self-pay | Admitting: Family Medicine

## 2016-02-09 ENCOUNTER — Encounter: Payer: Self-pay | Admitting: Family Medicine

## 2016-02-13 NOTE — Telephone Encounter (Signed)
NO refills without an OV  

## 2016-02-19 ENCOUNTER — Other Ambulatory Visit: Payer: Self-pay | Admitting: Family Medicine

## 2016-02-23 ENCOUNTER — Ambulatory Visit: Payer: Self-pay | Admitting: Family Medicine

## 2016-03-01 ENCOUNTER — Encounter: Payer: Self-pay | Admitting: Family Medicine

## 2016-03-01 ENCOUNTER — Ambulatory Visit (INDEPENDENT_AMBULATORY_CARE_PROVIDER_SITE_OTHER): Payer: Commercial Managed Care - PPO | Admitting: Family Medicine

## 2016-03-01 VITALS — BP 138/86 | HR 76 | Temp 98.1°F | Ht 68.0 in | Wt 207.0 lb

## 2016-03-01 DIAGNOSIS — Z0000 Encounter for general adult medical examination without abnormal findings: Secondary | ICD-10-CM | POA: Diagnosis not present

## 2016-03-01 LAB — CBC WITH DIFFERENTIAL/PLATELET
BASOS PCT: 0.6 % (ref 0.0–3.0)
Basophils Absolute: 0 10*3/uL (ref 0.0–0.1)
EOS ABS: 0.3 10*3/uL (ref 0.0–0.7)
EOS PCT: 3.9 % (ref 0.0–5.0)
HEMATOCRIT: 48.4 % (ref 39.0–52.0)
HEMOGLOBIN: 16.3 g/dL (ref 13.0–17.0)
LYMPHS PCT: 15.1 % (ref 12.0–46.0)
Lymphs Abs: 1.3 10*3/uL (ref 0.7–4.0)
MCHC: 33.7 g/dL (ref 30.0–36.0)
MCV: 91.4 fl (ref 78.0–100.0)
MONO ABS: 0.9 10*3/uL (ref 0.1–1.0)
Monocytes Relative: 9.7 % (ref 3.0–12.0)
Neutro Abs: 6.2 10*3/uL (ref 1.4–7.7)
Neutrophils Relative %: 70.7 % (ref 43.0–77.0)
Platelets: 325 10*3/uL (ref 150.0–400.0)
RBC: 5.3 Mil/uL (ref 4.22–5.81)
RDW: 13.9 % (ref 11.5–15.5)
WBC: 8.8 10*3/uL (ref 4.0–10.5)

## 2016-03-01 LAB — POC URINALSYSI DIPSTICK (AUTOMATED)
Blood, UA: NEGATIVE
GLUCOSE UA: NEGATIVE
NITRITE UA: NEGATIVE
SPEC GRAV UA: 1.02
UROBILINOGEN UA: 1
pH, UA: 5

## 2016-03-01 LAB — LIPID PANEL
CHOL/HDL RATIO: 3
Cholesterol: 117 mg/dL (ref 0–200)
HDL: 38.3 mg/dL — ABNORMAL LOW (ref >39.00)
LDL Cholesterol: 60 mg/dL (ref 0–99)
NONHDL: 78.94
Triglycerides: 93 mg/dL (ref 0.0–149.0)
VLDL: 18.6 mg/dL (ref 0.0–40.0)

## 2016-03-01 LAB — HEPATIC FUNCTION PANEL
ALK PHOS: 56 U/L (ref 39–117)
ALT: 25 U/L (ref 0–53)
AST: 22 U/L (ref 0–37)
Albumin: 4.4 g/dL (ref 3.5–5.2)
BILIRUBIN DIRECT: 0.2 mg/dL (ref 0.0–0.3)
BILIRUBIN TOTAL: 0.8 mg/dL (ref 0.2–1.2)
Total Protein: 6.6 g/dL (ref 6.0–8.3)

## 2016-03-01 LAB — BASIC METABOLIC PANEL
BUN: 17 mg/dL (ref 6–23)
CALCIUM: 9.4 mg/dL (ref 8.4–10.5)
CO2: 26 mEq/L (ref 19–32)
Chloride: 101 mEq/L (ref 96–112)
Creatinine, Ser: 1.24 mg/dL (ref 0.40–1.50)
GFR: 64.31 mL/min (ref >60.00)
Glucose, Bld: 86 mg/dL (ref 70–99)
POTASSIUM: 4.6 meq/L (ref 3.5–5.1)
SODIUM: 137 meq/L (ref 135–145)

## 2016-03-01 LAB — PSA: PSA: 3.71 ng/mL (ref 0.10–4.00)

## 2016-03-01 LAB — TSH: TSH: 1.54 u[IU]/mL (ref 0.35–4.50)

## 2016-03-01 MED ORDER — BUPROPION HCL ER (XL) 300 MG PO TB24: 300.0000 mg | ORAL_TABLET | Freq: Every day | ORAL | 3 refills | Status: DC

## 2016-03-01 MED ORDER — LOSARTAN POTASSIUM 100 MG PO TABS: 100.0000 mg | ORAL_TABLET | Freq: Every day | ORAL | 3 refills | Status: DC

## 2016-03-01 MED ORDER — MONTELUKAST SODIUM 10 MG PO TABS: 10.0000 mg | ORAL_TABLET | Freq: Every day | ORAL | 3 refills | Status: DC

## 2016-03-01 MED ORDER — TADALAFIL 5 MG PO TABS: ORAL_TABLET | ORAL | 3 refills | Status: DC

## 2016-03-01 MED ORDER — IPRATROPIUM-ALBUTEROL 0.5-2.5 (3) MG/3ML IN SOLN: 3.0000 mL | Freq: Four times a day (QID) | RESPIRATORY_TRACT | 3 refills | Status: DC | PRN

## 2016-03-01 MED ORDER — ALBUTEROL SULFATE HFA 108 (90 BASE) MCG/ACT IN AERS: INHALATION_SPRAY | RESPIRATORY_TRACT | 3 refills | Status: DC

## 2016-03-01 MED ORDER — ROSUVASTATIN CALCIUM 10 MG PO TABS: 10.0000 mg | ORAL_TABLET | Freq: Every day | ORAL | 3 refills | Status: DC

## 2016-03-01 MED ORDER — LAMOTRIGINE 100 MG PO TABS: 100.0000 mg | ORAL_TABLET | Freq: Every day | ORAL | 3 refills | Status: DC

## 2016-03-01 MED ORDER — AZELASTINE HCL 0.1 % NA SOLN: NASAL | 3 refills | Status: DC

## 2016-03-01 MED ORDER — OMEPRAZOLE 40 MG PO CPDR: 40.0000 mg | DELAYED_RELEASE_CAPSULE | Freq: Every day | ORAL | 3 refills | Status: DC

## 2016-03-01 NOTE — Progress Notes (Signed)
Pre visit review using our clinic review tool, if applicable. No additional management support is needed unless otherwise documented below in the visit note. 

## 2016-03-01 NOTE — Progress Notes (Signed)
   Subjective:    Patient ID: Andrew Swanson, male    DOB: 1960-10-07, 55 y.o.   MRN: [id number]  HPI 55 yr old male fir a well exam. He feels well.    Review of Systems  Constitutional: Negative.   HENT: Negative.   Eyes: Negative.   Respiratory: Negative.   Cardiovascular: Negative.   Gastrointestinal: Negative.   Genitourinary: Negative.   Musculoskeletal: Negative.   Skin: Negative.   Neurological: Negative.   Psychiatric/Behavioral: Negative.        Objective:   Physical Exam  Constitutional: He is oriented to person, place, and time. He appears well-developed and well-nourished. No distress.  HENT:  Head: Normocephalic and atraumatic.  Right Ear: External ear normal.  Left Ear: External ear normal.  Nose: Nose normal.  Mouth/Throat: Oropharynx is clear and moist. No oropharyngeal exudate.  Eyes: Conjunctivae and EOM are normal. Pupils are equal, round, and reactive to light. Right eye exhibits no discharge. Left eye exhibits no discharge. No scleral icterus.  Neck: Neck supple. No JVD present. No tracheal deviation present. No thyromegaly present.  Cardiovascular: Normal rate, regular rhythm, normal heart sounds and intact distal pulses.  Exam reveals no gallop and no friction rub.   No murmur heard. Pulmonary/Chest: Effort normal and breath sounds normal. No respiratory distress. He has no wheezes. He has no rales. He exhibits no tenderness.  Abdominal: Soft. Bowel sounds are normal. He exhibits no distension and no mass. There is no tenderness. There is no rebound and no guarding.  Genitourinary: Rectum normal, prostate normal and penis normal. Rectal exam shows guaiac negative stool. No penile tenderness.  Musculoskeletal: Normal range of motion. He exhibits no edema or tenderness.  Lymphadenopathy:    He has no cervical adenopathy.  Neurological: He is alert and oriented to person, place, and time. He has normal reflexes. No cranial nerve deficit. He exhibits  normal muscle tone. Coordination normal.  Skin: Skin is warm and dry. No rash noted. He is not diaphoretic. No erythema. No pallor.  Psychiatric: He has a normal mood and affect. His behavior is normal. Judgment and thought content normal.          Assessment & Plan:  Well exam. Get fasting labs. We discussed diet and exercise.  Nelwyn SalisburyFRY,Shenequa Howse A, MD

## 2016-03-08 ENCOUNTER — Other Ambulatory Visit: Payer: Self-pay | Admitting: Family Medicine

## 2016-03-22 ENCOUNTER — Encounter: Payer: Self-pay | Admitting: Family Medicine

## 2016-05-08 ENCOUNTER — Other Ambulatory Visit: Payer: Self-pay | Admitting: Family Medicine

## 2016-06-13 ENCOUNTER — Other Ambulatory Visit: Payer: Self-pay | Admitting: Family Medicine

## 2016-06-14 NOTE — Telephone Encounter (Signed)
Call in a 6 month supply (his last level was Oct 2017)

## 2016-06-27 ENCOUNTER — Other Ambulatory Visit: Payer: Self-pay | Admitting: Family Medicine

## 2016-08-06 DIAGNOSIS — M5117 Intervertebral disc disorders with radiculopathy, lumbosacral region: Secondary | ICD-10-CM | POA: Diagnosis not present

## 2016-08-06 DIAGNOSIS — M9905 Segmental and somatic dysfunction of pelvic region: Secondary | ICD-10-CM | POA: Diagnosis not present

## 2016-08-06 DIAGNOSIS — M9903 Segmental and somatic dysfunction of lumbar region: Secondary | ICD-10-CM | POA: Diagnosis not present

## 2016-08-16 DIAGNOSIS — M5412 Radiculopathy, cervical region: Secondary | ICD-10-CM | POA: Diagnosis not present

## 2016-08-16 DIAGNOSIS — M5441 Lumbago with sciatica, right side: Secondary | ICD-10-CM | POA: Diagnosis not present

## 2016-08-22 ENCOUNTER — Other Ambulatory Visit: Payer: Self-pay | Admitting: Family Medicine

## 2016-09-03 DIAGNOSIS — M5441 Lumbago with sciatica, right side: Secondary | ICD-10-CM | POA: Diagnosis not present

## 2016-09-03 DIAGNOSIS — M5412 Radiculopathy, cervical region: Secondary | ICD-10-CM | POA: Diagnosis not present

## 2016-09-24 DIAGNOSIS — M47816 Spondylosis without myelopathy or radiculopathy, lumbar region: Secondary | ICD-10-CM | POA: Diagnosis not present

## 2016-10-13 ENCOUNTER — Other Ambulatory Visit: Payer: Self-pay | Admitting: Family Medicine

## 2016-10-18 DIAGNOSIS — M5412 Radiculopathy, cervical region: Secondary | ICD-10-CM | POA: Diagnosis not present

## 2016-10-31 ENCOUNTER — Encounter: Payer: Self-pay | Admitting: Family Medicine

## 2016-10-31 ENCOUNTER — Ambulatory Visit (INDEPENDENT_AMBULATORY_CARE_PROVIDER_SITE_OTHER): Payer: Commercial Managed Care - PPO | Admitting: Family Medicine

## 2016-10-31 VITALS — BP 146/91 | HR 90 | Temp 99.0°F | Ht 68.0 in | Wt 199.0 lb

## 2016-10-31 DIAGNOSIS — G5623 Lesion of ulnar nerve, bilateral upper limbs: Secondary | ICD-10-CM | POA: Diagnosis not present

## 2016-10-31 NOTE — Patient Instructions (Signed)
WE NOW OFFER   [facility] FAST TRACK!!!  SAME DAY Appointments for ACUTE CARE  Such as: Sprains, Injuries, cuts, abrasions, rashes, muscle pain, joint pain, back pain Colds, flu, sore throats, headache, allergies, cough, fever  Ear pain, sinus and eye infections Abdominal pain, nausea, vomiting, diarrhea, upset stomach Animal/insect bites  3 Easy Ways to Schedule: Walk-In Scheduling Call in scheduling Mychart Sign-up: [email or web address]         

## 2016-10-31 NOTE — Progress Notes (Signed)
   Subjective:    Patient ID: Andrew Swanson, male    DOB: [birth year], 56 y.o.   MRN: [id number]  HPI Here for advice about numbness and pain in the arms and hands. About 3 months ago this process started, and it affects both arms equally. He describes numbness and tinlging that starts at the elbows and runs down the ulnar side of both forearms and into the 3rd, 4th, and 5th fingers of both hands. Sometimes he also has pain in these areas. He denies any pain in the neck. He has seen [facility] for this, and he wanted to perform decompression therapy on him. However his insurance company refused to pay for this. [facility] and they wanted to order an MRI of the cervical spine. However his insurance company also refused to pay for this.    Review of Systems  Constitutional: Negative.   Respiratory: Negative.   Cardiovascular: Negative.   Musculoskeletal: Negative for neck pain and neck stiffness.  Neurological: Positive for numbness.       Objective:   Physical Exam  Constitutional: He appears well-developed and well-nourished.  Cardiovascular: Normal rate, regular rhythm, normal heart sounds and intact distal pulses.   Pulmonary/Chest: Effort normal and breath sounds normal.  Musculoskeletal:  His neck exam is normal. Full ROM, no crepitus, no tenderness  Neurological:  He has decreased sensation to light touch along the ulnar sides of both forearms and the 4th and 5th fingers. Grip strength is full.           Assessment & Plan:  He has bilateral ulnar neuropathy. It is not clear if this is the result of a cervical spine issue or of something like cubital tunnel syndrome. We will set him up for nerve conduction studies on both arms.  Gershon CraneStephen Conlin Brahm, MD

## 2016-11-07 ENCOUNTER — Encounter: Payer: Self-pay | Admitting: Family Medicine

## 2016-11-08 NOTE — Telephone Encounter (Signed)
I did put in that order the day he was here. [facility]. He can call them at [phone] and get more information

## 2016-11-15 ENCOUNTER — Encounter: Payer: Self-pay | Admitting: Neurology

## 2016-11-19 ENCOUNTER — Other Ambulatory Visit: Payer: Self-pay | Admitting: *Deleted

## 2016-11-19 DIAGNOSIS — G629 Polyneuropathy, unspecified: Secondary | ICD-10-CM

## 2016-12-05 ENCOUNTER — Ambulatory Visit (INDEPENDENT_AMBULATORY_CARE_PROVIDER_SITE_OTHER): Payer: Commercial Managed Care - PPO | Admitting: Neurology

## 2016-12-05 DIAGNOSIS — G629 Polyneuropathy, unspecified: Secondary | ICD-10-CM

## 2016-12-05 DIAGNOSIS — M5412 Radiculopathy, cervical region: Secondary | ICD-10-CM

## 2016-12-05 NOTE — Procedures (Signed)
[facility] Neurology  [address] [place], [address]  [place], [place] [address] Tel: [phone] Fax:  [phone] Test Date:  12/05/2016  Patient: Andrew Swanson DOB: 08-24-1960 Physician: Nita Sickle, DO  Sex: Male Height: 5\' 8"  Ref Phys: Gershon Crane, MD  ID#: [id number] Temp: 37.9C Technician:    Patient Complaints: This is a 56 year old gentleman referred for evaluation of bilateral hand paresthesias over the fourth and fifth digits.  NCV & EMG Findings: Extensive electrodiagnostic testing of the right upper extremity and additional studies of the left shows:  1. Bilateral median, ulnar, and right dorsal ulnar cutaneous sensory response is within normal limits. 2. Bilateral median and ulnar motor responses are within normal limits. 3. 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 Active denervation is not present in the cervical paraspinal muscles.  Impression: 1. 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 2. In particular, there is no evidence of an ulnar neuropathy.   ___________________________ Nita Sickle, DO    Nerve Conduction Studies Anti Sensory Summary Table   Site NR Peak (ms) Norm Peak (ms) P-T Amp (V) Norm P-T Amp  Right DorsCutan Anti Sensory (Dorsum 5th MC)  37.9C  Wrist    1.9 <3.1 12.3 >10  Left Median Anti Sensory (2nd Digit)  37.9C  Wrist    3.0 <3.6 15.2 >15  Right Median Anti Sensory (2nd Digit)  37.9C  Wrist    2.9 <3.6 16.2 >15  Left Ulnar Anti Sensory (5th Digit)  37.9C  Wrist    2.8 <3.1 15.2 >10  Right Ulnar Anti Sensory (5th Digit)  37.9C  Wrist    2.4 <3.1 11.7 >10   Motor Summary Table   Site NR Onset (ms) Norm Onset (ms) O-P Amp (mV) Norm O-P Amp Site1 Site2 Delta-0 (ms) Dist (cm) Vel (m/s) Norm Vel (m/s)  Left Median Motor (Abd Poll Brev)  37.9C    Wrist    3.0 <4.0 9.7 >6 Elbow Wrist 4.3 26.0 60 >50  Elbow    7.3  9.6         Right Median Motor (Abd Poll Brev)  37.9C  Wrist    2.0 <4.0 14.9 >6 Elbow Wrist 5.2 27 58 >50  Elbow    7.2  12.9         Left Ulnar Motor (Abd Dig Minimi)  37.9C  Wrist    2.5 <3.1 10.1 >7 B Elbow Wrist 3.7 24.0 65 >50  B Elbow    6.2  9.2  A Elbow B Elbow 1.7 10.0 59 >50  A Elbow    7.9  8.2         Right Ulnar Motor (Abd Dig Minimi)  37.9C  Wrist    2.2 <3.1 11.0 >7 B Elbow Wrist 3.8 22.0 58 >50  B Elbow    6.0  10.2  A Elbow B Elbow 1.7 10.0 59 >50  A Elbow    7.7  9.7          EMG   Side Muscle Ins Act Fibs Psw Fasc Number Recrt Dur Dur. Amp Amp. Poly Poly. Comment  Right 1stDorInt Nml Nml Nml Nml 1- Rapid Some 1+ Some 1+ Nml Nml N/A  Right FlexPolLong Nml Nml Nml Nml Nml Nml Nml Nml Nml Nml Nml Nml N/A  Right Ext Indicis Nml Nml Nml Nml 1- Rapid Some 1+  Some 1+ Nml Nml N/A  Right PronatorTeres Nml Nml Nml Nml Nml Nml Nml Nml Nml Nml Nml Nml N/A  Right Biceps Nml Nml Nml Nml Nml Nml Nml Nml Nml Nml Nml Nml N/A  Right Triceps Nml 1+ Nml Nml 2- Rapid Many 1+ Many 1+ Nml Nml N/A  Right Deltoid Nml Nml Nml Nml Nml Nml Nml Nml Nml Nml Nml Nml N/A  Right Cervical Parasp Low Nml Nml Nml Nml Nml Nml Nml Nml Nml Nml Nml Nml N/A  Left 1stDorInt Nml Nml Nml Nml 1- Rapid Few 1+ Few 1+ Nml Nml N/A  Left Ext Indicis Nml Nml Nml Nml Nml Nml Nml Nml Nml Nml Nml Nml N/A  Left Triceps Nml Nml Nml Nml 1- Rapid Some 1+ Some 1+ Nml Nml N/A      Waveforms:

## 2016-12-06 ENCOUNTER — Telehealth: Payer: Self-pay | Admitting: Family Medicine

## 2016-12-06 NOTE — Telephone Encounter (Signed)
Andrew Swanson pt returned your call °

## 2016-12-06 NOTE — Telephone Encounter (Signed)
I spoke with pt and went over results. 

## 2016-12-09 NOTE — Addendum Note (Signed)
Addended by: Gershon Crane A on: 12/09/2016 05:16 PM   Modules accepted: Orders

## 2016-12-15 ENCOUNTER — Ambulatory Visit
Admission: RE | Admit: 2016-12-15 | Discharge: 2016-12-15 | Disposition: A | Discharge status: Home / Self Care | Payer: Commercial Managed Care - PPO | Source: Ambulatory Visit | Attending: Family Medicine | Admitting: Family Medicine

## 2016-12-15 DIAGNOSIS — M50222 Other cervical disc displacement at C5-C6 level: Secondary | ICD-10-CM | POA: Diagnosis not present

## 2016-12-15 DIAGNOSIS — G5623 Lesion of ulnar nerve, bilateral upper limbs: Secondary | ICD-10-CM

## 2016-12-17 NOTE — Addendum Note (Signed)
Addended by: Gershon CraneFRY, STEPHEN A on: 12/17/2016 01:35 PM   Modules accepted: Orders

## 2016-12-26 ENCOUNTER — Encounter: Payer: Self-pay | Admitting: Family Medicine

## 2017-01-02 ENCOUNTER — Encounter: Payer: Self-pay | Admitting: Family Medicine

## 2017-01-03 DIAGNOSIS — M50222 Other cervical disc displacement at C5-C6 level: Secondary | ICD-10-CM | POA: Diagnosis not present

## 2017-01-17 DIAGNOSIS — M4802 Spinal stenosis, cervical region: Secondary | ICD-10-CM | POA: Diagnosis not present

## 2017-01-17 DIAGNOSIS — M50222 Other cervical disc displacement at C5-C6 level: Secondary | ICD-10-CM | POA: Diagnosis not present

## 2017-01-17 DIAGNOSIS — R109 Unspecified abdominal pain: Secondary | ICD-10-CM | POA: Diagnosis not present

## 2017-01-17 DIAGNOSIS — M50022 Cervical disc disorder at C5-C6 level with myelopathy: Secondary | ICD-10-CM | POA: Diagnosis not present

## 2017-01-21 ENCOUNTER — Other Ambulatory Visit: Payer: Self-pay | Admitting: Family Medicine

## 2017-02-13 DIAGNOSIS — M50222 Other cervical disc displacement at C5-C6 level: Secondary | ICD-10-CM | POA: Diagnosis not present

## 2017-02-19 ENCOUNTER — Other Ambulatory Visit: Payer: Self-pay | Admitting: Family Medicine

## 2017-02-20 ENCOUNTER — Other Ambulatory Visit: Payer: Self-pay | Admitting: Family Medicine

## 2017-02-21 NOTE — Telephone Encounter (Signed)
Sent to PCP for approval.  

## 2017-02-24 NOTE — Telephone Encounter (Signed)
He will need to have a level checked first.

## 2017-02-24 NOTE — Telephone Encounter (Signed)
He will need a level drawn

## 2017-03-06 ENCOUNTER — Other Ambulatory Visit: Payer: Self-pay | Admitting: Family Medicine

## 2017-03-10 ENCOUNTER — Telehealth: Payer: Self-pay | Admitting: Family Medicine

## 2017-03-10 NOTE — Telephone Encounter (Signed)
Sent to PCP for approval. Testosterone was last tested 01/22/2016. Last refilled 06/17/2016 10 ML with 5 refills. Last OV 10/31/2016.

## 2017-03-10 NOTE — Telephone Encounter (Signed)
Per Dr. Clent RidgesFry cancel refill on Testosterone, pt is due for labs first. I did call [facility] to cancel order.

## 2017-03-10 NOTE — Telephone Encounter (Signed)
Please refill for 6 months 

## 2017-03-11 NOTE — Telephone Encounter (Signed)
I sent pt a my chart message, advised to schedule physical, labs are due.

## 2017-03-14 DIAGNOSIS — M50222 Other cervical disc displacement at C5-C6 level: Secondary | ICD-10-CM | POA: Diagnosis not present

## 2017-03-16 ENCOUNTER — Other Ambulatory Visit: Payer: Self-pay | Admitting: Family Medicine

## 2017-03-17 ENCOUNTER — Encounter: Payer: Self-pay | Admitting: Family Medicine

## 2017-03-17 ENCOUNTER — Ambulatory Visit: Payer: Commercial Managed Care - PPO | Admitting: Family Medicine

## 2017-03-17 VITALS — BP 152/80 | HR 73 | Temp 98.4°F | Wt 204.0 lb

## 2017-03-17 DIAGNOSIS — N401 Enlarged prostate with lower urinary tract symptoms: Secondary | ICD-10-CM

## 2017-03-17 DIAGNOSIS — I10 Essential (primary) hypertension: Secondary | ICD-10-CM

## 2017-03-17 DIAGNOSIS — E291 Testicular hypofunction: Secondary | ICD-10-CM | POA: Diagnosis not present

## 2017-03-17 DIAGNOSIS — K219 Gastro-esophageal reflux disease without esophagitis: Secondary | ICD-10-CM

## 2017-03-17 DIAGNOSIS — N138 Other obstructive and reflux uropathy: Secondary | ICD-10-CM | POA: Diagnosis not present

## 2017-03-17 DIAGNOSIS — J449 Chronic obstructive pulmonary disease, unspecified: Secondary | ICD-10-CM

## 2017-03-17 LAB — LIPID PANEL
Cholesterol: 140 mg/dL (ref 0–200)
HDL: 41.8 mg/dL (ref >39.00)
LDL Cholesterol: 71 mg/dL (ref 0–99)
NONHDL: 98.31
Total CHOL/HDL Ratio: 3
Triglycerides: 135 mg/dL (ref 0.0–149.0)
VLDL: 27 mg/dL (ref 0.0–40.0)

## 2017-03-17 LAB — HEPATIC FUNCTION PANEL
ALT: 26 U/L (ref 0–53)
AST: 23 U/L (ref 0–37)
Albumin: 4.6 g/dL (ref 3.5–5.2)
Alkaline Phosphatase: 72 U/L (ref 39–117)
BILIRUBIN DIRECT: 0.1 mg/dL (ref 0.0–0.3)
BILIRUBIN TOTAL: 0.7 mg/dL (ref 0.2–1.2)
Total Protein: 7 g/dL (ref 6.0–8.3)

## 2017-03-17 LAB — POC URINALSYSI DIPSTICK (AUTOMATED)
Bilirubin, UA: NEGATIVE
Blood, UA: NEGATIVE
Glucose, UA: NEGATIVE
Ketones, UA: NEGATIVE
LEUKOCYTES UA: NEGATIVE
Nitrite, UA: NEGATIVE
PH UA: 5.5 (ref 5.0–8.0)
PROTEIN UA: NEGATIVE
SPEC GRAV UA: 1.025 (ref 1.010–1.025)
Urobilinogen, UA: 0.2 E.U./dL

## 2017-03-17 LAB — CBC WITH DIFFERENTIAL/PLATELET
BASOS ABS: 0.1 10*3/uL (ref 0.0–0.1)
Basophils Relative: 0.8 % (ref 0.0–3.0)
EOS ABS: 0.4 10*3/uL (ref 0.0–0.7)
EOS PCT: 4.3 % (ref 0.0–5.0)
HCT: 46.3 % (ref 39.0–52.0)
HEMOGLOBIN: 15.2 g/dL (ref 13.0–17.0)
Lymphocytes Relative: 19.4 % (ref 12.0–46.0)
Lymphs Abs: 1.6 10*3/uL (ref 0.7–4.0)
MCHC: 32.8 g/dL (ref 30.0–36.0)
MCV: 94 fl (ref 78.0–100.0)
MONO ABS: 0.8 10*3/uL (ref 0.1–1.0)
Monocytes Relative: 9.8 % (ref 3.0–12.0)
NEUTROS PCT: 65.7 % (ref 43.0–77.0)
Neutro Abs: 5.5 10*3/uL (ref 1.4–7.7)
Platelets: 314 10*3/uL (ref 150.0–400.0)
RBC: 4.92 Mil/uL (ref 4.22–5.81)
RDW: 13.7 % (ref 11.5–15.5)
WBC: 8.3 10*3/uL (ref 4.0–10.5)

## 2017-03-17 LAB — BASIC METABOLIC PANEL
BUN: 15 mg/dL (ref 6–23)
CHLORIDE: 105 meq/L (ref 96–112)
CO2: 28 meq/L (ref 19–32)
Calcium: 10 mg/dL (ref 8.4–10.5)
Creatinine, Ser: 1.01 mg/dL (ref 0.40–1.50)
GFR: 81.18 mL/min (ref >60.00)
Glucose, Bld: 99 mg/dL (ref 70–99)
Potassium: 4.6 mEq/L (ref 3.5–5.1)
SODIUM: 142 meq/L (ref 135–145)

## 2017-03-17 LAB — TESTOSTERONE: TESTOSTERONE: 121.71 ng/dL — AB (ref 300.00–890.00)

## 2017-03-17 LAB — PSA: PSA: 2.54 ng/mL (ref 0.10–4.00)

## 2017-03-17 LAB — TSH: TSH: 3.62 u[IU]/mL (ref 0.35–4.50)

## 2017-03-17 MED ORDER — OMEPRAZOLE 40 MG PO CPDR: 40.0000 mg | DELAYED_RELEASE_CAPSULE | Freq: Every day | ORAL | 3 refills | Status: DC

## 2017-03-17 MED ORDER — TESTOSTERONE CYPIONATE 200 MG/ML IM SOLN: 200.0000 mg | INTRAMUSCULAR | 1 refills | Status: DC

## 2017-03-17 MED ORDER — ROSUVASTATIN CALCIUM 10 MG PO TABS: 10.0000 mg | ORAL_TABLET | Freq: Every day | ORAL | 3 refills | Status: DC

## 2017-03-17 MED ORDER — LOSARTAN POTASSIUM 100 MG PO TABS: 100.0000 mg | ORAL_TABLET | Freq: Every day | ORAL | 3 refills | Status: DC

## 2017-03-17 MED ORDER — TADALAFIL 5 MG PO TABS: ORAL_TABLET | ORAL | 3 refills | Status: DC

## 2017-03-17 NOTE — Progress Notes (Signed)
   Subjective:    Patient ID: Andrew NakaiBruce W Beaudry, male    DOB: 11/07/1960, 56 y.o.   MRN: [id number]  HPI Here to follow up on issues. He had a fusion of C5-C6 per Dr. Dutch QuintPoole on 01-17-17 and this has been healing well. He hopes to return to work tomorrow. He feels well in general.    Review of Systems  Constitutional: Negative.   Respiratory: Negative.   Cardiovascular: Negative.   Gastrointestinal: Negative.   Musculoskeletal: Negative.   Neurological: Negative.        Objective:   Physical Exam  Constitutional: He is oriented to person, place, and time. He appears well-developed and well-nourished.  Neck: No thyromegaly present.  Cardiovascular: Normal rate, regular rhythm, normal heart sounds and intact distal pulses.  Pulmonary/Chest: Effort normal and breath sounds normal. No respiratory distress. He has no wheezes. He has no rales.  Abdominal: Soft. Bowel sounds are normal. He exhibits no distension. There is no tenderness. There is no rebound and no guarding.  Lymphadenopathy:    He has no cervical adenopathy.  Neurological: He is alert and oriented to person, place, and time.          Assessment & Plan:  He has recovered from cervical spine surgery. His HTN and GERD and COPD are stable. Get fasting labs to day to include a testosterone level.  Gershon CraneStephen Fry, MD

## 2017-03-24 ENCOUNTER — Encounter: Payer: Self-pay | Admitting: Family Medicine

## 2017-03-27 ENCOUNTER — Telehealth: Payer: Self-pay | Admitting: Family Medicine

## 2017-03-27 NOTE — Telephone Encounter (Signed)
Copied from CRM [phone]. Topic: Quick Communication - Rx Refill/Question >> Mar 27, 2017  5:30 PM Darletta MollLander, Lumin L wrote: Has the patient contacted their pharmacy? Yes.   (Agent: If no, request that the patient contact the pharmacy for the refill.) Preferred Pharmacy (with phone number or street name): [facility] Agent: Please be advised that RX refills may take up to 3 business days. We ask that you follow-up with your pharmacy. Have been faxing the office for a few days and keeps failing. 1.losartan 2.singulair 3.cialis 4.lamotrigine 5.rosuvastatin 6.symbacort 7.omeprazole 8.azelastine

## 2017-03-28 ENCOUNTER — Telehealth: Payer: Self-pay | Admitting: Family Medicine

## 2017-03-28 ENCOUNTER — Other Ambulatory Visit: Payer: Self-pay

## 2017-03-28 MED ORDER — AZELASTINE HCL 0.1 % NA SOLN: NASAL | 0 refills | Status: DC

## 2017-03-28 MED ORDER — BUDESONIDE-FORMOTEROL FUMARATE 160-4.5 MCG/ACT IN AERO: INHALATION_SPRAY | RESPIRATORY_TRACT | 0 refills | Status: DC

## 2017-03-28 NOTE — Telephone Encounter (Signed)
Medications were refilled at [facility] on 03-17-17/ Closing encounter /  Disp Refills Start End   losartan (COZAAR) 100 MG tablet 90 tablet 3 03/17/2017    Sig - Route: Take 1 tablet (100 mg total) by mouth daily. - Oral   Sent to pharmacy as: losartan (COZAAR) 100 MG tablet   Notes to Pharmacy: Needs appt   E-Prescribing Status: Receipt confirmed by pharmacy (03/17/2017 10:06 AM EST)   /  Disp Refills Start End   montelukast (SINGULAIR) 10 MG tablet 90 tablet 3 02/19/2017    Sig - Route: TAKE 1 TABLET (10 MG TOTAL) BY MOUTH AT BEDTIME. - Oral   Sent to pharmacy as: montelukast (SINGULAIR) 10 MG tablet   E-Prescribing Status: Receipt confirmed by pharmacy (02/19/2017 4:05 PM EST)    Has refills so patient should contact the pharmacy /   Disp Refills Start End   tadalafil (CIALIS) 5 MG tablet 90 tablet 3 03/17/2017    Sig: TAKE 1 TABLET BY MOUTH EVERY DAY AS NEEDED FOR ED   Sent to pharmacy as: tadalafil (CIALIS) 5 MG tablet   E-Prescribing Status: Receipt confirmed by pharmacy (03/17/2017 10:06 AM EST)    / Has refills so patient should contact the pharmacy   Disp Refills Start End   lamoTRIgine (LAMICTAL) 100 MG tablet 90 tablet 3 02/19/2017    Sig - Route: TAKE 1 TABLET (100 MG TOTAL) BY MOUTH DAILY. - Oral   Sent to pharmacy as: lamoTRIgine (LAMICTAL) 100 MG tablet   E-Prescribing Status: Receipt confirmed by pharmacy (02/19/2017 4:05 PM EST)    /  Disp Refills Start End   rosuvastatin (CRESTOR) 10 MG tablet 90 tablet 3 03/17/2017    Sig - Route: Take 1 tablet (10 mg total) by mouth daily. - Oral   Sent to pharmacy as: rosuvastatin (CRESTOR) 10 MG tablet   E-Prescribing Status: Receipt confirmed by pharmacy (03/17/2017 10:06 AM EST)    /  Disp Refills Start End   budesonide-formoterol (SYMBICORT) 160-4.5 MCG/ACT inhaler 90 Inhaler 0 03/28/2017    Sig: INHALE 2 PUFFS INTO THE LUNGS TWICE A DAY   Sent to pharmacy as: budesonide-formoterol (SYMBICORT) 160-4.5 MCG/ACT inhaler    E-Prescribing Status: Receipt confirmed by pharmacy (03/28/2017 7:28 AM EST)    /  Disp Refills Start End   omeprazole (PRILOSEC) 40 MG capsule 90 capsule 3 03/17/2017    Sig - Route: Take 1 capsule (40 mg total) by mouth daily. - Oral   Sent to pharmacy as: omeprazole (PRILOSEC) 40 MG capsule   E-Prescribing Status: Receipt confirmed by pharmacy (03/17/2017 10:06 AM EST)    /  Disp Refills Start End   azelastine (ASTELIN) 0.1 % nasal spray 90 mL 0 03/28/2017    Sig: USE 1 SPRAY IN EACH NOSTRIL EVERY DAY AS DIRECTED   Sent to pharmacy as: azelastine (ASTELIN) 0.1 % nasal spray   E-Prescribing Status: Receipt confirmed by pharmacy (03/28/2017 7:26 AM EST)

## 2017-03-28 NOTE — Telephone Encounter (Signed)
Copied from CRM #[id number]. Topic: Quick Communication - See Telephone Encounter >> Mar 28, 2017  4:31 PM Rudi CocoLathan, Uzziah Rigg M, NT wrote: CRM for notification. See Telephone encounter for:   03/28/17. Doniesha called from [facility]. Reference # [id number]  90 day supply in instead of 90 inhalers verbal or fax of new rx. Is needed   [facility] [place] [facility] [id number] [place] [address] [place] [id number] Phone: [phone] Fax: [phone] Not a 24 hour pharmacy; exact hours not known

## 2017-03-31 ENCOUNTER — Telehealth: Payer: Self-pay | Admitting: *Deleted

## 2017-03-31 NOTE — Telephone Encounter (Signed)
Prior auth for Testosterone injection solution was sent to [email or web address].

## 2017-03-31 NOTE — Telephone Encounter (Signed)
Called [facility] and corrected the error.

## 2017-04-01 ENCOUNTER — Telehealth: Payer: Self-pay

## 2017-04-01 NOTE — Telephone Encounter (Signed)
RF request for rosuvastatin Rx was refilled 03/17/2017 for a 90 day supply with 3 refills [facility] Called pt and left a VM to call back to let me know if they [facility]?

## 2017-04-02 NOTE — Telephone Encounter (Signed)
This encounter was created in error - please disregard.

## 2017-04-04 ENCOUNTER — Telehealth: Payer: Self-pay | Admitting: Family Medicine

## 2017-04-04 MED ORDER — TADALAFIL 5 MG PO TABS: ORAL_TABLET | ORAL | 3 refills | Status: DC

## 2017-04-04 MED ORDER — LOSARTAN POTASSIUM 100 MG PO TABS: 100.0000 mg | ORAL_TABLET | Freq: Every day | ORAL | 3 refills | Status: DC

## 2017-04-04 MED ORDER — ROSUVASTATIN CALCIUM 10 MG PO TABS: 10.0000 mg | ORAL_TABLET | Freq: Every day | ORAL | 3 refills | Status: DC

## 2017-04-04 MED ORDER — MONTELUKAST SODIUM 10 MG PO TABS: 10.0000 mg | ORAL_TABLET | Freq: Every day | ORAL | 3 refills | Status: DC

## 2017-04-04 NOTE — Telephone Encounter (Signed)
Copied from CRM [phone]#[id number]. Topic: Quick Communication - Rx Refill/Question >> Apr 04, 2017  9:24 AM Landry MellowFoltz, Melissa J wrote: Has the patient contacted their pharmacy? Yes.     (Agent: If no, request that the patient contact the pharmacy for the refill.)   Preferred Pharmacy (with phone number or street name): [facility] , needs refills on montelukast (SINGULAIR) 10 MG tablet losartan (COZAAR) 100 MG tablet rosuvastatin (CRESTOR) 10 MG tablet tadalafil (CIALIS) 5 MG tablet  cb #[phone] fax # [phone]. Pharmacy states that they all have no refills.      Agent: Please be advised that RX refills may take up to 3 business days. We ask that you follow-up with your pharmacy.

## 2017-04-04 NOTE — Telephone Encounter (Signed)
[facility] is requesting refills on: Losartan-written 03/17/17 for 90 days supply with 3 refills Montelukast-wirtten 02/19/17 for 90 day supply with 3 refills Rosuvastatin-written 03/17/17 for 90 supply with 3 refills Tadalafil-written 03/17/17 with for 90 supplye  3 refills / Per pharmacy they have no refills /

## 2017-04-04 NOTE — Telephone Encounter (Signed)
Last OV 03/17/2017. [facility] were [facility]. [facility] now [facility].

## 2017-04-09 ENCOUNTER — Other Ambulatory Visit: Payer: Self-pay | Admitting: Family Medicine

## 2017-04-11 ENCOUNTER — Other Ambulatory Visit: Payer: Self-pay | Admitting: Family Medicine

## 2017-06-16 ENCOUNTER — Ambulatory Visit: Payer: Commercial Managed Care - PPO | Admitting: Family Medicine

## 2017-06-24 ENCOUNTER — Encounter: Payer: Self-pay | Admitting: Family Medicine

## 2017-06-24 ENCOUNTER — Ambulatory Visit: Payer: Commercial Managed Care - PPO | Admitting: Family Medicine

## 2017-06-24 VITALS — BP 140/72 | HR 100 | Temp 98.4°F | Wt 207.0 lb

## 2017-06-24 DIAGNOSIS — J209 Acute bronchitis, unspecified: Secondary | ICD-10-CM

## 2017-06-24 DIAGNOSIS — F3162 Bipolar disorder, current episode mixed, moderate: Secondary | ICD-10-CM

## 2017-06-24 MED ORDER — AZITHROMYCIN 250 MG PO TABS: ORAL_TABLET | ORAL | 0 refills | Status: DC

## 2017-06-24 MED ORDER — LAMOTRIGINE 100 MG PO TABS: 100.0000 mg | ORAL_TABLET | Freq: Two times a day (BID) | ORAL | 3 refills | Status: DC

## 2017-06-24 NOTE — Progress Notes (Signed)
   Subjective:    Patient ID: Andrew Swanson, male    DOB: [birth year], 57 y.o.   MRN: [id number]  HPI Here for several issues. First he has had stuffy head, PND, chest tightness and coughing up yellow sputum for 3 days. He has had some fevers at night. Also he has had more mood swings in te past month or two. His depression seems to be stable but he has spells of anxiety at times and he gets easily irritated. He sleeps well.    Review of Systems  Constitutional: Positive for fever.  HENT: Positive for congestion and postnasal drip. Negative for sinus pressure, sinus pain and sore throat.   Eyes: Negative.   Respiratory: Positive for cough and chest tightness. Negative for shortness of breath and wheezing.   Psychiatric/Behavioral: Positive for agitation. Negative for confusion, decreased concentration, dysphoric mood, hallucinations and sleep disturbance. The patient is nervous/anxious.        Objective:   Physical Exam  Constitutional: He is oriented to person, place, and time. He appears well-developed and well-nourished.  HENT:  Right Ear: External ear normal.  Left Ear: External ear normal.  Nose: Nose normal.  Mouth/Throat: Oropharynx is clear and moist.  Eyes: Conjunctivae are normal.  Neck: No thyromegaly present.  Pulmonary/Chest: Effort normal. No respiratory distress. He has no rales.  Scattered wheezes and rhonchi   Lymphadenopathy:    He has no cervical adenopathy.  Neurological: He is alert and oriented to person, place, and time.  Psychiatric: He has a normal mood and affect. His behavior is normal. Judgment and thought content normal.          Assessment & Plan:  He has an early bronchitis and we will treat this with a Zpack. For the bipolar disorder increase Lamictal to 100 mg bid.  Gershon CraneStephen Fry, MD

## 2017-08-07 ENCOUNTER — Other Ambulatory Visit: Payer: Self-pay | Admitting: Family Medicine

## 2017-08-19 ENCOUNTER — Other Ambulatory Visit: Payer: Self-pay | Admitting: Family Medicine

## 2017-09-01 ENCOUNTER — Other Ambulatory Visit: Payer: Self-pay | Admitting: Family Medicine

## 2017-09-18 ENCOUNTER — Other Ambulatory Visit: Payer: Self-pay | Admitting: Family Medicine

## 2017-09-18 NOTE — Telephone Encounter (Signed)
Last OV 06/24/2017   Last refilled 03/17/2017 disp 12 ml with 1 refill  Sent to PCP for approval

## 2017-09-18 NOTE — Telephone Encounter (Signed)
Refill for 6 months. 

## 2017-10-21 ENCOUNTER — Encounter: Payer: Self-pay | Admitting: Family Medicine

## 2017-12-26 ENCOUNTER — Other Ambulatory Visit: Payer: Self-pay | Admitting: Family Medicine

## 2018-03-02 ENCOUNTER — Other Ambulatory Visit: Payer: Self-pay | Admitting: Family Medicine

## 2018-03-02 DIAGNOSIS — H25813 Combined forms of age-related cataract, bilateral: Secondary | ICD-10-CM | POA: Diagnosis not present

## 2018-03-02 DIAGNOSIS — H35033 Hypertensive retinopathy, bilateral: Secondary | ICD-10-CM | POA: Diagnosis not present

## 2018-03-04 ENCOUNTER — Other Ambulatory Visit: Payer: Self-pay | Admitting: Family Medicine

## 2018-03-15 HISTORY — PX: CATARACT EXTRACTION, BILATERAL: SHX1313

## 2018-03-20 ENCOUNTER — Other Ambulatory Visit: Payer: Self-pay | Admitting: Family Medicine

## 2018-03-23 ENCOUNTER — Other Ambulatory Visit: Payer: Self-pay | Admitting: Family Medicine

## 2018-03-23 DIAGNOSIS — H2511 Age-related nuclear cataract, right eye: Secondary | ICD-10-CM | POA: Diagnosis not present

## 2018-03-30 DIAGNOSIS — H25811 Combined forms of age-related cataract, right eye: Secondary | ICD-10-CM | POA: Diagnosis not present

## 2018-03-30 DIAGNOSIS — H2511 Age-related nuclear cataract, right eye: Secondary | ICD-10-CM | POA: Diagnosis not present

## 2018-04-06 DIAGNOSIS — H2512 Age-related nuclear cataract, left eye: Secondary | ICD-10-CM | POA: Diagnosis not present

## 2018-04-09 DIAGNOSIS — H5711 Ocular pain, right eye: Secondary | ICD-10-CM | POA: Diagnosis not present

## 2018-04-09 DIAGNOSIS — H20041 Secondary noninfectious iridocyclitis, right eye: Secondary | ICD-10-CM | POA: Diagnosis not present

## 2018-04-09 DIAGNOSIS — Z961 Presence of intraocular lens: Secondary | ICD-10-CM | POA: Diagnosis not present

## 2018-04-13 DIAGNOSIS — H25812 Combined forms of age-related cataract, left eye: Secondary | ICD-10-CM | POA: Diagnosis not present

## 2018-04-13 DIAGNOSIS — H2512 Age-related nuclear cataract, left eye: Secondary | ICD-10-CM | POA: Diagnosis not present

## 2018-04-14 ENCOUNTER — Other Ambulatory Visit: Payer: Self-pay | Admitting: Family Medicine

## 2018-05-12 ENCOUNTER — Encounter: Payer: Self-pay | Admitting: Family Medicine

## 2018-05-12 ENCOUNTER — Ambulatory Visit (INDEPENDENT_AMBULATORY_CARE_PROVIDER_SITE_OTHER): Payer: Commercial Managed Care - PPO | Admitting: Family Medicine

## 2018-05-12 VITALS — BP 158/80 | HR 77 | Temp 98.0°F | Ht 68.0 in | Wt 211.5 lb

## 2018-05-12 DIAGNOSIS — Z0000 Encounter for general adult medical examination without abnormal findings: Secondary | ICD-10-CM | POA: Diagnosis not present

## 2018-05-12 DIAGNOSIS — E291 Testicular hypofunction: Secondary | ICD-10-CM | POA: Diagnosis not present

## 2018-05-12 LAB — CBC WITH DIFFERENTIAL/PLATELET
Basophils Absolute: 0.1 10*3/uL (ref 0.0–0.1)
Basophils Relative: 0.5 % (ref 0.0–3.0)
Eosinophils Absolute: 0.3 10*3/uL (ref 0.0–0.7)
Eosinophils Relative: 2.9 % (ref 0.0–5.0)
HCT: 46.4 % (ref 39.0–52.0)
Hemoglobin: 15.6 g/dL (ref 13.0–17.0)
Lymphocytes Relative: 17.1 % (ref 12.0–46.0)
Lymphs Abs: 1.6 10*3/uL (ref 0.7–4.0)
MCHC: 33.7 g/dL (ref 30.0–36.0)
MCV: 93.6 fl (ref 78.0–100.0)
Monocytes Absolute: 0.8 10*3/uL (ref 0.1–1.0)
Monocytes Relative: 8.4 % (ref 3.0–12.0)
Neutro Abs: 6.8 10*3/uL (ref 1.4–7.7)
Neutrophils Relative %: 71.1 % (ref 43.0–77.0)
Platelets: 342 10*3/uL (ref 150.0–400.0)
RBC: 4.95 Mil/uL (ref 4.22–5.81)
RDW: 14 % (ref 11.5–15.5)
WBC: 9.6 10*3/uL (ref 4.0–10.5)

## 2018-05-12 LAB — HEPATIC FUNCTION PANEL
ALT: 24 U/L (ref 0–53)
AST: 21 U/L (ref 0–37)
Albumin: 4.3 g/dL (ref 3.5–5.2)
Alkaline Phosphatase: 60 U/L (ref 39–117)
BILIRUBIN TOTAL: 0.6 mg/dL (ref 0.2–1.2)
Bilirubin, Direct: 0.1 mg/dL (ref 0.0–0.3)
TOTAL PROTEIN: 6.6 g/dL (ref 6.0–8.3)

## 2018-05-12 LAB — LIPID PANEL
Cholesterol: 182 mg/dL (ref 0–200)
HDL: 35.5 mg/dL — ABNORMAL LOW (ref >39.00)
LDL Cholesterol: 112 mg/dL — ABNORMAL HIGH (ref 0–99)
NonHDL: 146.85
Total CHOL/HDL Ratio: 5
Triglycerides: 172 mg/dL — ABNORMAL HIGH (ref 0.0–149.0)
VLDL: 34.4 mg/dL (ref 0.0–40.0)

## 2018-05-12 LAB — BASIC METABOLIC PANEL
BUN: 12 mg/dL (ref 6–23)
CHLORIDE: 102 meq/L (ref 96–112)
CO2: 28 mEq/L (ref 19–32)
Calcium: 9.4 mg/dL (ref 8.4–10.5)
Creatinine, Ser: 1.05 mg/dL (ref 0.40–1.50)
GFR: 72.73 mL/min (ref >60.00)
Glucose, Bld: 88 mg/dL (ref 70–99)
Potassium: 4 mEq/L (ref 3.5–5.1)
Sodium: 137 mEq/L (ref 135–145)

## 2018-05-12 LAB — TESTOSTERONE: Testosterone: 731.07 ng/dL (ref 300.00–890.00)

## 2018-05-12 LAB — TSH: TSH: 3.18 u[IU]/mL (ref 0.35–4.50)

## 2018-05-12 LAB — PSA: PSA: 3.35 ng/mL (ref 0.10–4.00)

## 2018-05-12 MED ORDER — LOSARTAN POTASSIUM-HCTZ 100-25 MG PO TABS: 1.0000 | ORAL_TABLET | Freq: Every day | ORAL | 3 refills | Status: DC

## 2018-05-12 MED ORDER — "NEEDLE (DISP) 22G X 1-1/2"" MISC": 1.0000 "application " | 11 refills | Status: DC

## 2018-05-12 MED ORDER — SYRINGE (DISPOSABLE) 3 ML MISC: 1.0000 | 11 refills | Status: DC

## 2018-05-12 MED ORDER — AZITHROMYCIN 250 MG PO TABS: ORAL_TABLET | ORAL | 0 refills | Status: DC

## 2018-05-12 MED ORDER — TESTOSTERONE CYPIONATE 200 MG/ML IM SOLN: INTRAMUSCULAR | 1 refills | Status: DC

## 2018-05-12 NOTE — Progress Notes (Signed)
Subjective:    Patient ID: Andrew Swanson, male    DOB: 1961-02-12, 58 y.o.   MRN: [id number]  HPI Jere for a well exam and also to complain of 2 weeks of chest congestion and coughing up yellow sputum. No fever. He is taking Mucinex. Also he notes his BP has been running a little high for a few months. 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    Review of Systems  Constitutional: Negative.   HENT: Negative.   Eyes: Negative.   Respiratory: Positive for cough and chest tightness.   Cardiovascular: Negative.   Gastrointestinal: Negative.   Genitourinary: Negative.   Musculoskeletal: Negative.   Skin: Negative.   Neurological: Negative.   Psychiatric/Behavioral: Negative.        Objective:   Physical Exam Constitutional:      General: He is not in acute distress.    Appearance: He is well-developed. He is not diaphoretic.  HENT:     Head: Normocephalic and atraumatic.     Right Ear: External ear normal.     Left Ear: External ear normal.     Nose: Nose normal.     Mouth/Throat:     Pharynx: No oropharyngeal exudate.  Eyes:     General: No scleral icterus.       Right eye: No discharge.        Left eye: No discharge.     Conjunctiva/sclera: Conjunctivae normal.     Pupils: Pupils are equal, round, and reactive to light.  Neck:     Musculoskeletal: Neck supple.     Thyroid: No thyromegaly.     Vascular: No JVD.     Trachea: No tracheal deviation.  Cardiovascular:     Rate and Rhythm: Normal rate and regular rhythm.     Heart sounds: Normal heart sounds. No murmur. No friction rub. No gallop.   Pulmonary:     Effort: Pulmonary effort is normal. No respiratory distress.     Breath sounds: No rales.     Comments: Scattered rhonchi and wheezes  Chest:     Chest wall: No tenderness.  Abdominal:     General: Bowel sounds are normal. There is no distension.     Palpations: Abdomen is soft. There is no  mass.     Tenderness: There is no abdominal tenderness. There is no guarding or rebound.  Genitourinary:    Penis: Normal. No tenderness.      Prostate: Normal.     Rectum: Normal. Guaiac result negative.  Musculoskeletal: Normal range of motion.        General: No tenderness.  Lymphadenopathy:     Cervical: No cervical adenopathy.  Skin:    General: Skin is warm and dry.     Coloration: Skin is not pale.     Findings: No erythema or rash.  Neurological:     Mental Status: He is alert and oriented to person, place, and time.     Cranial Nerves: No cranial nerve deficit.     Motor: No abnormal muscle tone.     Coordination: Coordination normal.     Deep Tendon Reflexes: Reflexes are normal and symmetric. Reflexes normal.  Psychiatric:        Behavior: Behavior normal.        Thought Content: Thought content normal.        Judgment: Judgment normal.  Assessment & Plan:  Well exam. We discussed diet and exercise. Get fasting labs. Also treat the bronchitis with a Zpack. For the HTN we will stop Losartan and begin Losartan HCT 100-25 daily. Recheck in 3-4 weeks. Set up his first colonoscopy.  Gershon Crane, MD

## 2018-05-15 ENCOUNTER — Encounter: Payer: Self-pay | Admitting: *Deleted

## 2018-06-03 ENCOUNTER — Other Ambulatory Visit: Payer: Self-pay | Admitting: Family Medicine

## 2018-06-23 ENCOUNTER — Other Ambulatory Visit: Payer: Self-pay | Admitting: Family Medicine

## 2018-06-23 NOTE — Telephone Encounter (Signed)
Dr. Clent Ridges please advise on refill of the lamictal.  Thanks

## 2018-07-20 ENCOUNTER — Other Ambulatory Visit: Payer: Self-pay | Admitting: Family Medicine

## 2018-07-21 ENCOUNTER — Other Ambulatory Visit: Payer: Self-pay | Admitting: Family Medicine

## 2018-08-30 ENCOUNTER — Other Ambulatory Visit: Payer: Self-pay | Admitting: Family Medicine

## 2018-09-08 ENCOUNTER — Encounter: Payer: Self-pay | Admitting: Family Medicine

## 2018-09-13 ENCOUNTER — Other Ambulatory Visit: Payer: Self-pay | Admitting: Family Medicine

## 2018-09-26 ENCOUNTER — Other Ambulatory Visit: Payer: Self-pay | Admitting: Family Medicine

## 2018-11-17 ENCOUNTER — Other Ambulatory Visit: Payer: Self-pay | Admitting: Family Medicine

## 2018-11-17 NOTE — Telephone Encounter (Signed)
Last filled 05/12/2018 Last OV 05/12/2018  Ok to fill?

## 2018-12-07 ENCOUNTER — Other Ambulatory Visit: Payer: Self-pay | Admitting: Family Medicine

## 2018-12-10 ENCOUNTER — Other Ambulatory Visit: Payer: Self-pay | Admitting: Family Medicine

## 2019-01-02 ENCOUNTER — Other Ambulatory Visit: Payer: Self-pay | Admitting: Family Medicine

## 2019-02-04 IMAGING — MR MR CERVICAL SPINE W/O CM
4 of 5 series · 27 of 48 positions shown · non-contrast
Comparison: Cervical spine CT dated 11/22/2013

CLINICAL DATA: Numbness and pain in the fourth and fifth fingers of
both hands for the past 4 months. Weakness in the hands.

EXAM:
MRI CERVICAL SPINE WITHOUT CONTRAST
TECHNIQUE: Multiplanar, multisequence MR imaging of the cervical spine was
performed. No intravenous contrast was administered.

[Series 2: T2 · sagittal · 3.0mm · 0.41mm/px · 7 of 13 slices shown (1 of 2)]
[im 1/13]
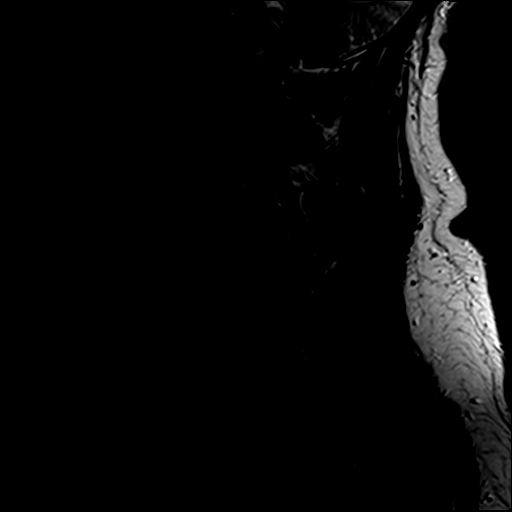
[im 3/13]
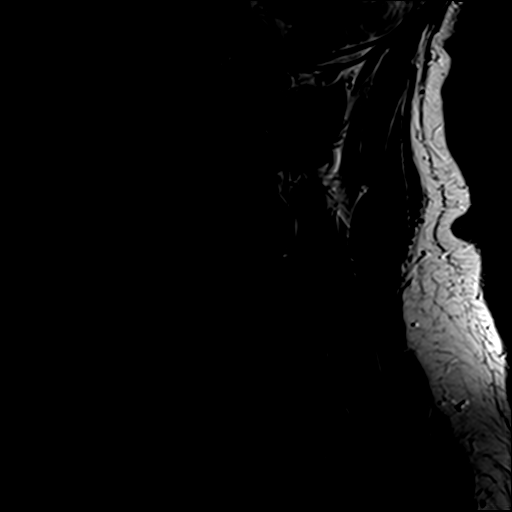
[im 5/13]
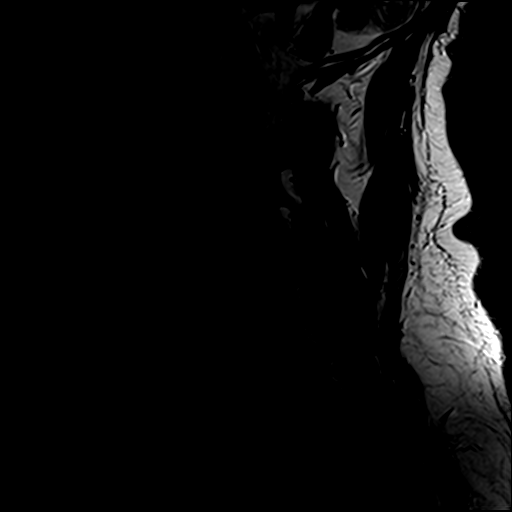
[im 7/13]
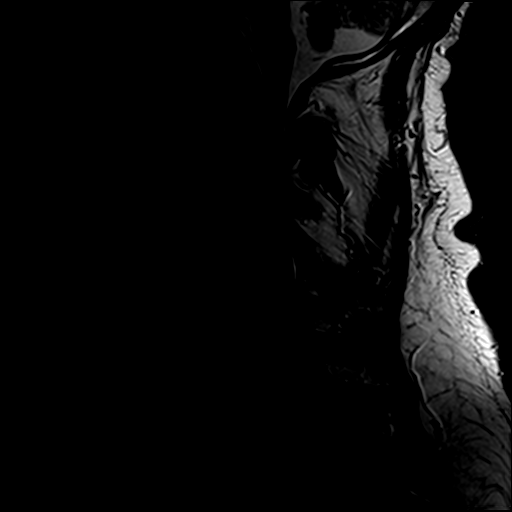
[im 9/13]
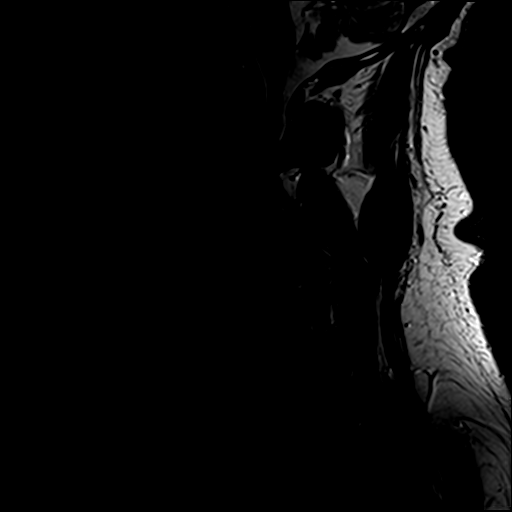
[im 11/13]
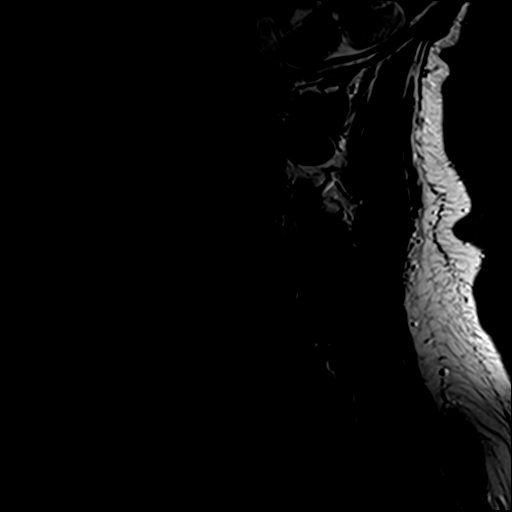
[im 13/13]
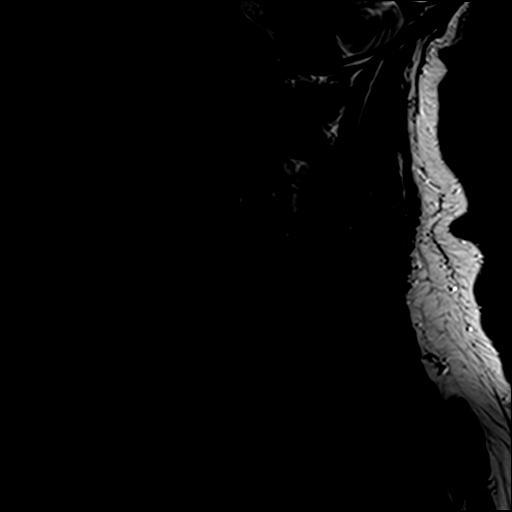

[Series 3: T1 · sagittal · 3.0mm · 0.41mm/px · 7 of 13 slices shown]
[im 1/13]
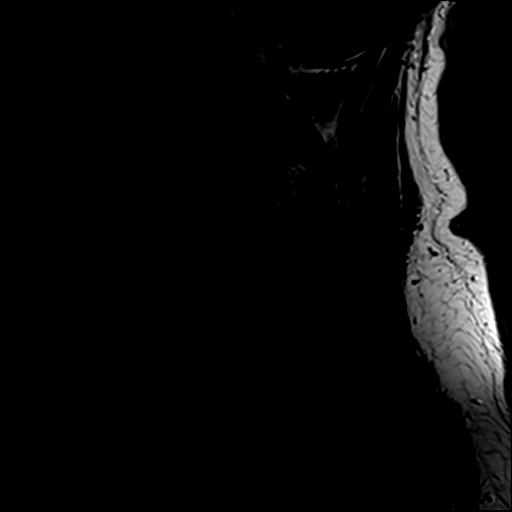
[im 3/13]
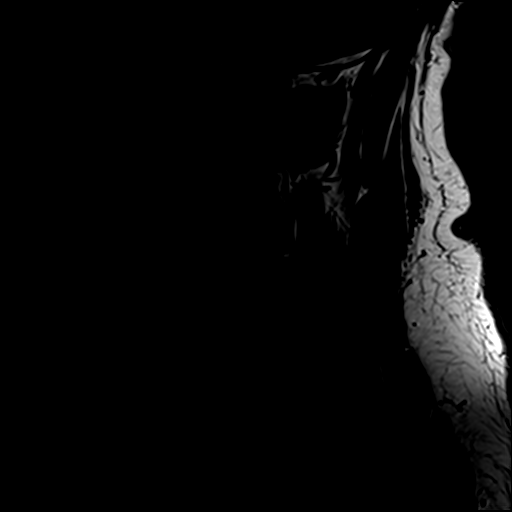
[im 5/13]
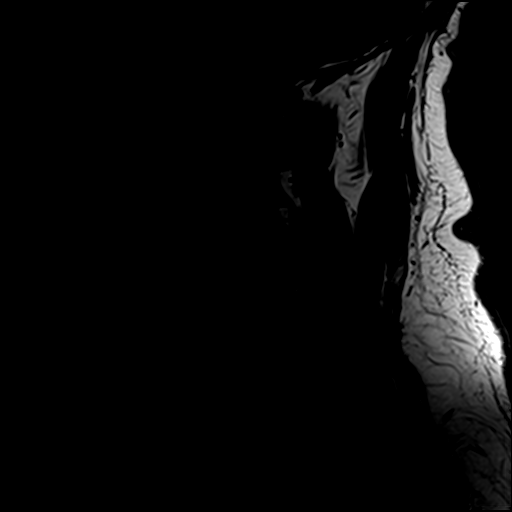
[im 7/13]
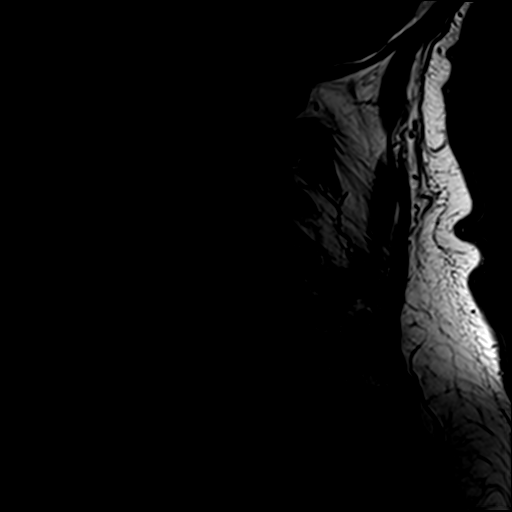
[im 9/13]
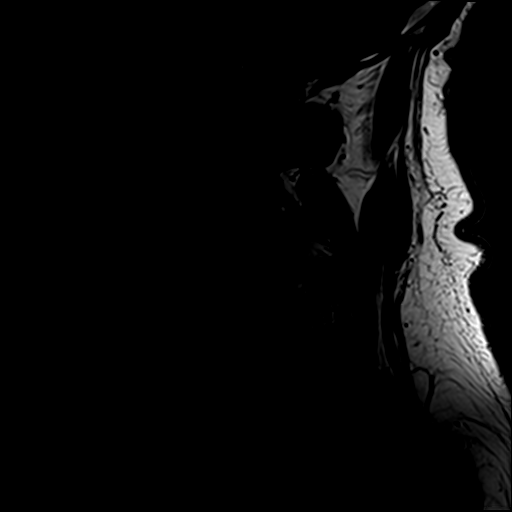
[im 11/13]
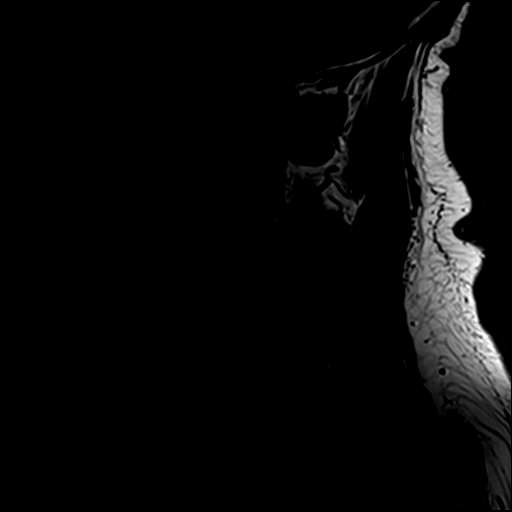
[im 13/13]
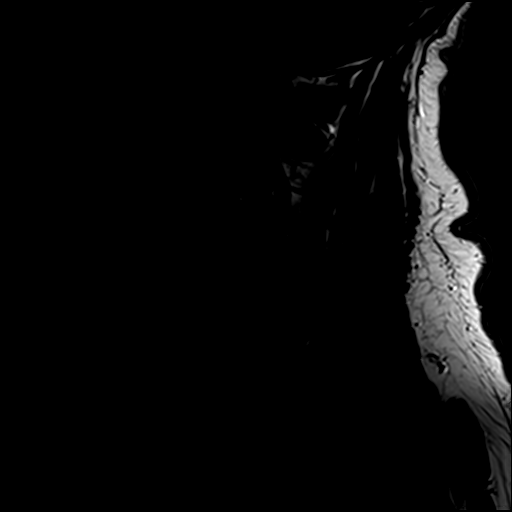

[Series 4: T2 · axial · 3.0mm · 0.70mm/px · z∈[-73,+33]mm · 8 of 29 slices shown (2 of 2)]
[im 1/29]
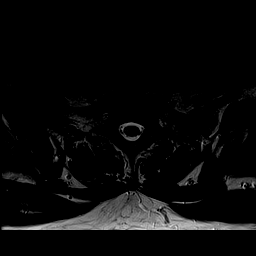
[im 5/29]
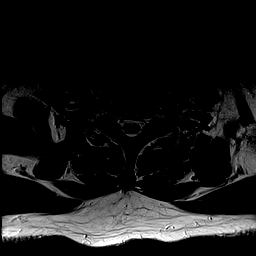
[im 9/29]
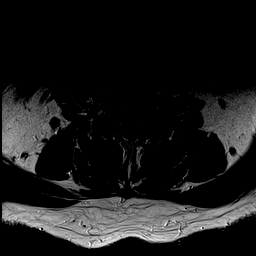
[im 13/29]
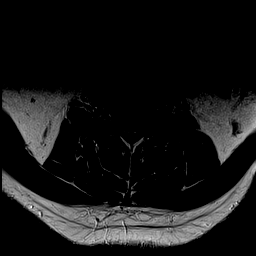
[im 16/29]
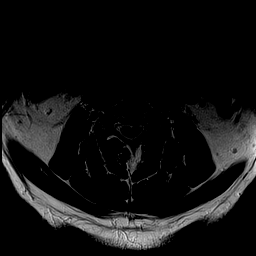
[im 20/29]
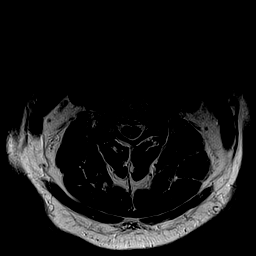
[im 24/29]
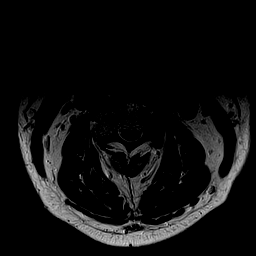
[im 29/29]
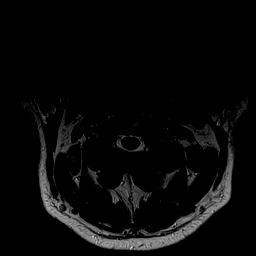

[Series 6: STIR · sagittal · 3.0mm · 0.82mm/px · 5 of 13 slices shown]
[im 1/13]
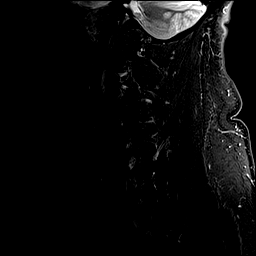
[im 3/13]
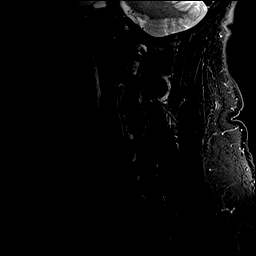
[im 5/13]
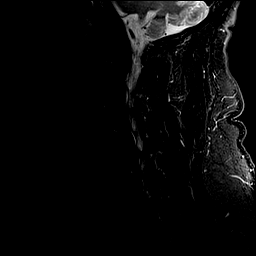
[im 8/13]
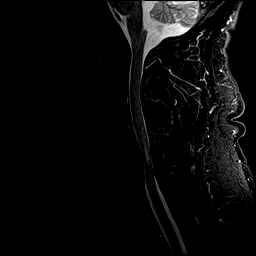
[im 13/13]
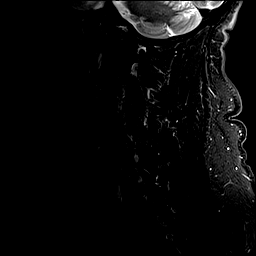

[27 of 48 positions shown; findings below may reference images not displayed]

FINDINGS: Alignment: Chronic subtle kyphosis at C5-6.  No subluxation.

Vertebrae: The mild disc desiccation throughout the cervical spine.
Type 2 degenerative endplate findings at C5-6. Congenitally short
pedicles in the cervical spine.

Cord: There is a focal 1.8 cm in long axis region of abnormal cord
edema or myelomalacia centered at the C5-6 level associated with the
central narrowing of the thecal sac at this level. The accentuated
T2 signal is mildly more confluent just below the intervertebral
level eccentric to the right as on image [DATE].

Posterior Fossa, vertebral arteries, paraspinal tissues:
Unremarkable

Disc levels:

C2-3: Prominent left and moderate right foraminal stenosis and mild
central narrowing of the thecal sac due to short pedicles, left
greater than right uncinate spurring, disc bulge, and small central
disc protrusion.

C3-4: Prominent right and moderate left foraminal stenosis and
borderline central narrowing of the thecal sac due to uncinate and
facet spurring, short pedicles, and small central disc protrusion.

C4-5: Prominent left and mild right foraminal stenosis along with
borderline central narrowing of the thecal sac due to short
pedicles, left uncinate and facet spurring, and left foraminal disc
protrusion.

C5-6: Severe central narrowing of the thecal sac along with
prominent bilateral foraminal stenosis due to central disc
protrusion, posterior osseous ridging, uncinate spurring, facet
arthropathy, and disc bulge. AP diameter of the thecal sac narrowed
to 5 mm and there is abnormal signal in the cord at this level
compatible with edema or myelomalacia.

C6-7: Prominent bilateral foraminal stenosis and moderate slightly
left eccentric central narrowing of the thecal sac due to disc
bulge, small left eccentric disc protrusion, uncinate spurring, and
facet arthropathy, and short pedicles.

C7-T1: Mild bilateral foraminal stenosis due to uncinate and facet
spurring along with short pedicles.
IMPRESSION: 1. Abnormal cord edema or myelomalacia associated with the severe
central narrowing of the thecal sac at C5-6 due to congenitally
short pedicles, central and right paracentral disc protrusion, disc
bulge, posterior osseous ridging, uncinate spurring, and facet
arthropathy. There is also prominent bilateral foraminal impingement
at this level.
2. Congenitally short pedicles, cervical spondylosis, and
degenerative disc disease also cause prominent degrees of
impingement at C2- 3, C3-4, C4-5, and C6-7 ; and mild impingement at
C7-T1, as detailed above.

## 2019-02-08 ENCOUNTER — Other Ambulatory Visit: Payer: Self-pay | Admitting: Family Medicine

## 2019-02-08 NOTE — Telephone Encounter (Signed)
Okay for 90 day refill?   Pt had last Office visit 04/2018 for CPE

## 2019-02-09 ENCOUNTER — Other Ambulatory Visit: Payer: Self-pay | Admitting: Family Medicine

## 2019-04-01 ENCOUNTER — Other Ambulatory Visit: Payer: Self-pay | Admitting: Family Medicine

## 2019-05-25 ENCOUNTER — Other Ambulatory Visit: Payer: Self-pay | Admitting: Family Medicine

## 2019-05-27 ENCOUNTER — Other Ambulatory Visit: Payer: Self-pay | Admitting: Family Medicine

## 2019-05-27 NOTE — Telephone Encounter (Signed)
Last filled 11/18/2018 Last OV 05/12/2018  Ok to fill?

## 2019-06-22 ENCOUNTER — Other Ambulatory Visit: Payer: Self-pay | Admitting: Family Medicine

## 2019-06-24 ENCOUNTER — Other Ambulatory Visit: Payer: Self-pay | Admitting: Family Medicine

## 2019-06-24 NOTE — Telephone Encounter (Signed)
Patient need to schedule an ov for more refills. Due for a CPE.

## 2019-07-02 ENCOUNTER — Other Ambulatory Visit: Payer: Self-pay | Admitting: Family Medicine

## 2019-07-09 ENCOUNTER — Other Ambulatory Visit: Payer: Self-pay | Admitting: Family Medicine

## 2019-07-15 ENCOUNTER — Other Ambulatory Visit: Payer: Self-pay | Admitting: Family Medicine

## 2019-07-21 ENCOUNTER — Other Ambulatory Visit: Payer: Self-pay | Admitting: Family Medicine

## 2019-07-26 ENCOUNTER — Other Ambulatory Visit: Payer: Self-pay | Admitting: Family Medicine

## 2019-08-04 ENCOUNTER — Other Ambulatory Visit: Payer: Self-pay | Admitting: Family Medicine

## 2019-08-06 ENCOUNTER — Other Ambulatory Visit: Payer: Self-pay | Admitting: Family Medicine

## 2019-08-10 NOTE — Telephone Encounter (Signed)
Patient need to schedule an ov for more refills. Pt is due for CPE. 

## 2019-08-10 NOTE — Telephone Encounter (Signed)
Please call our office to schedule a physically for further refills.

## 2019-08-18 ENCOUNTER — Other Ambulatory Visit: Payer: Self-pay | Admitting: Family Medicine

## 2019-08-18 NOTE — Telephone Encounter (Signed)
Patient need to schedule an ov for more refills. Cpe was due in 04/2019.

## 2019-08-19 ENCOUNTER — Other Ambulatory Visit: Payer: Self-pay | Admitting: Family Medicine

## 2019-08-20 ENCOUNTER — Other Ambulatory Visit: Payer: Self-pay | Admitting: Family Medicine

## 2019-08-20 ENCOUNTER — Encounter: Payer: Self-pay | Admitting: Family Medicine

## 2019-08-20 NOTE — Telephone Encounter (Signed)
Pt was informed that he needs an appt and scheduled for May 10th

## 2019-08-23 ENCOUNTER — Telehealth (INDEPENDENT_AMBULATORY_CARE_PROVIDER_SITE_OTHER): Payer: Commercial Managed Care - PPO | Admitting: Family Medicine

## 2019-08-23 ENCOUNTER — Encounter: Payer: Self-pay | Admitting: Family Medicine

## 2019-08-23 VITALS — Temp 97.3°F | Ht 68.0 in | Wt 208.0 lb

## 2019-08-23 DIAGNOSIS — J019 Acute sinusitis, unspecified: Secondary | ICD-10-CM | POA: Diagnosis not present

## 2019-08-23 MED ORDER — LAMOTRIGINE 100 MG PO TABS: 100.0000 mg | ORAL_TABLET | Freq: Two times a day (BID) | ORAL | 11 refills | Status: DC

## 2019-08-23 MED ORDER — AZITHROMYCIN 250 MG PO TABS
ORAL_TABLET | ORAL | 0 refills | Status: DC
Start: 2019-08-23 — End: 2020-04-21

## 2019-08-23 MED ORDER — METHYLPREDNISOLONE 4 MG PO TBPK: ORAL_TABLET | ORAL | 0 refills | Status: DC

## 2019-08-23 MED ORDER — BUDESONIDE-FORMOTEROL FUMARATE 160-4.5 MCG/ACT IN AERO: INHALATION_SPRAY | RESPIRATORY_TRACT | 11 refills | Status: DC

## 2019-08-23 MED ORDER — AZELASTINE HCL 0.1 % NA SOLN: NASAL | 11 refills | Status: DC

## 2019-08-23 MED ORDER — BUPROPION HCL ER (XL) 300 MG PO TB24: 300.0000 mg | ORAL_TABLET | Freq: Every day | ORAL | 11 refills | Status: DC

## 2019-08-23 MED ORDER — TESTOSTERONE CYPIONATE 200 MG/ML IM SOLN: INTRAMUSCULAR | 5 refills | Status: DC

## 2019-08-23 MED ORDER — TADALAFIL 5 MG PO TABS: ORAL_TABLET | ORAL | 11 refills | Status: DC

## 2019-08-23 MED ORDER — OMEPRAZOLE 40 MG PO CPDR: DELAYED_RELEASE_CAPSULE | ORAL | 11 refills | Status: DC

## 2019-08-23 MED ORDER — LOSARTAN POTASSIUM-HCTZ 100-25 MG PO TABS: 1.0000 | ORAL_TABLET | Freq: Every day | ORAL | 11 refills | Status: DC

## 2019-08-23 MED ORDER — ALBUTEROL SULFATE HFA 108 (90 BASE) MCG/ACT IN AERS: INHALATION_SPRAY | RESPIRATORY_TRACT | 11 refills | Status: DC

## 2019-08-23 MED ORDER — MONTELUKAST SODIUM 10 MG PO TABS: ORAL_TABLET | ORAL | 11 refills | Status: DC

## 2019-08-23 MED ORDER — ROSUVASTATIN CALCIUM 10 MG PO TABS: 10.0000 mg | ORAL_TABLET | Freq: Every day | ORAL | 11 refills | Status: DC

## 2019-08-23 NOTE — Progress Notes (Signed)
   Subjective:    Patient ID: Andrew Swanson, male    DOB: 05-Aug-1960, 59 y.o.   MRN: [id number]  HPI Here for refills and for one week of sinus pressure, PND, and a dry cough. No fever.    Review of Systems  Constitutional: Negative.   HENT: Positive for congestion, postnasal drip and sinus pressure. Negative for sore throat.   Eyes: Negative.   Respiratory: Negative.  Negative for apnea and choking.        Objective:   Physical Exam        Assessment & Plan:  Sinusitis, treat with a Zpack and a Medrol dose pack. Recheck prn.  Gershon Crane, MD

## 2019-08-24 ENCOUNTER — Telehealth: Payer: Self-pay

## 2019-08-24 NOTE — Telephone Encounter (Signed)
[name] Key: [id number] - PA Case ID: [id number] - Rx #: [id number] Need help? Call us at [phone] Outcome Additional Information Required [facility] does not handle this review. Please visit [email or web address] to start a prior authorization or fax information to [phone]. Please include all supporting chart notes. You may contact [employer] at [phone]. Drug Testosterone Cypionate 200MG /ML intramuscular solution Form [facility] Electronic Prior Authorization Form (2017 NCPDP) Original Claim Info 75 PA Req MD 02-02-2005 [phone] Review Req-MD Visit [phone] or Call [phone] for [place] Drug Requires Prior Authorization

## 2019-08-27 NOTE — Telephone Encounter (Signed)
Drug/Service Name: TESTOSTERONE CYP 500 MG/2.5 ML Physician/Nurse: Gershon Crane EOC ID: [id number] Status: In Progress Date Requested: 08/27/2019 78:97:84 Date Closed:  Dispensing Location:

## 2019-08-27 NOTE — Telephone Encounter (Signed)
     Andrew Swanson    TESTOSTERONE CYP 200 MG/ML     STEPHEN FRY        Download Request  Print Thank you for creating a prior authorization request using Prompt[employer]. The prior authorization department will contact you if additional information is needed. Once a decision is made you will be notified of the decision. You can check the status of your request using the Check Status link. Please note down (Prior Auth EOC ID) to check status. Prior Authorization request details: Prior Auth (EOC) ID: [id number] Drug/Service Name: TESTOSTERONE CYP 200 MG/ML Patient: Andrew Swanson Date Requested: 08/27/2019 09:24   MemberID: [id number] DOB: April 20, 1960    New PA started through another site. Awaiting approval

## 2019-09-01 NOTE — Telephone Encounter (Signed)
Note sure why this was denied there are no notes advising. Calling pt to update.

## 2019-09-01 NOTE — Telephone Encounter (Signed)
Spoke to pt and he stated his insurance never pasy for the medication. Pt stated that he always pays out of pocket and no PA is needed for this in the future unless insurance change.

## 2019-09-01 NOTE — Telephone Encounter (Signed)
Received message today for denial:   Drug/Service Name: TESTOSTERONE CYP 200 MG/ML Physician/Nurse: Gershon Crane [facility] ID: [id number] Status: Denied Date Requested: 08/27/2019 09:24:24 Date Closed: 09/01/2019 07:56:05

## 2019-09-07 ENCOUNTER — Other Ambulatory Visit: Payer: Self-pay | Admitting: Family Medicine

## 2019-10-10 ENCOUNTER — Encounter: Payer: Self-pay | Admitting: Family Medicine

## 2019-10-11 ENCOUNTER — Encounter: Payer: Self-pay | Admitting: Family Medicine

## 2019-10-13 ENCOUNTER — Telehealth: Payer: Self-pay | Admitting: Family Medicine

## 2019-10-13 NOTE — Telephone Encounter (Signed)
  Pt is supposed to pick up a letter that's left at the front desk. Pt would like the letter faxed to [phone] [facility]. [phone] pt contact number he would like a call when done.

## 2019-10-13 NOTE — Telephone Encounter (Signed)
The letter is ready to be picked up or faxed

## 2019-10-13 NOTE — Telephone Encounter (Signed)
Letter has been faxed. Spoke with the patient, he is aware.

## 2019-12-17 ENCOUNTER — Other Ambulatory Visit: Payer: Self-pay | Admitting: Family Medicine

## 2019-12-17 NOTE — Telephone Encounter (Signed)
Last filled 08/23/2019 Last OV 08/23/2019  Ok to fill?

## 2019-12-18 NOTE — Telephone Encounter (Signed)
He needs to have a testosterone level checked first. Please set this up

## 2019-12-21 ENCOUNTER — Other Ambulatory Visit: Payer: Self-pay

## 2019-12-21 DIAGNOSIS — E291 Testicular hypofunction: Secondary | ICD-10-CM

## 2019-12-21 NOTE — Telephone Encounter (Signed)
Left message for patient to call back and schedule a lab appointment.

## 2020-03-20 ENCOUNTER — Other Ambulatory Visit: Payer: Self-pay | Admitting: Family Medicine

## 2020-03-22 NOTE — Telephone Encounter (Signed)
No refills until he has a blood level drawn (orders are in)

## 2020-04-04 ENCOUNTER — Encounter: Payer: Self-pay | Admitting: Family Medicine

## 2020-04-05 ENCOUNTER — Encounter: Payer: Commercial Managed Care - PPO | Admitting: Family Medicine

## 2020-04-21 ENCOUNTER — Encounter: Payer: Self-pay | Admitting: Family Medicine

## 2020-04-21 ENCOUNTER — Other Ambulatory Visit: Payer: Self-pay

## 2020-04-21 ENCOUNTER — Ambulatory Visit (INDEPENDENT_AMBULATORY_CARE_PROVIDER_SITE_OTHER): Payer: Commercial Managed Care - PPO | Admitting: Family Medicine

## 2020-04-21 VITALS — BP 120/74 | HR 81 | Temp 97.9°F | Ht 68.0 in | Wt 201.3 lb

## 2020-04-21 DIAGNOSIS — Z0000 Encounter for general adult medical examination without abnormal findings: Secondary | ICD-10-CM

## 2020-04-21 DIAGNOSIS — E291 Testicular hypofunction: Secondary | ICD-10-CM

## 2020-04-21 DIAGNOSIS — Z1211 Encounter for screening for malignant neoplasm of colon: Secondary | ICD-10-CM

## 2020-04-21 LAB — LIPID PANEL
Cholesterol: 169 mg/dL (ref 0–200)
HDL: 43.4 mg/dL (ref >39.00)
LDL Cholesterol: 85 mg/dL (ref 0–99)
NonHDL: 125.23
Total CHOL/HDL Ratio: 4
Triglycerides: 199 mg/dL — ABNORMAL HIGH (ref 0.0–149.0)
VLDL: 39.8 mg/dL (ref 0.0–40.0)

## 2020-04-21 LAB — HEPATIC FUNCTION PANEL
ALT: 38 U/L (ref 0–53)
AST: 25 U/L (ref 0–37)
Albumin: 4.4 g/dL (ref 3.5–5.2)
Alkaline Phosphatase: 73 U/L (ref 39–117)
Bilirubin, Direct: 0.1 mg/dL (ref 0.0–0.3)
Total Bilirubin: 0.5 mg/dL (ref 0.2–1.2)
Total Protein: 6.8 g/dL (ref 6.0–8.3)

## 2020-04-21 LAB — CBC WITH DIFFERENTIAL/PLATELET
Basophils Absolute: 0.1 10*3/uL (ref 0.0–0.1)
Basophils Relative: 0.9 % (ref 0.0–3.0)
Eosinophils Absolute: 0.5 10*3/uL (ref 0.0–0.7)
Eosinophils Relative: 6.3 % — ABNORMAL HIGH (ref 0.0–5.0)
HCT: 47.6 % (ref 39.0–52.0)
Hemoglobin: 15.9 g/dL (ref 13.0–17.0)
Lymphocytes Relative: 20.6 % (ref 12.0–46.0)
Lymphs Abs: 1.7 10*3/uL (ref 0.7–4.0)
MCHC: 33.3 g/dL (ref 30.0–36.0)
MCV: 92.3 fl (ref 78.0–100.0)
Monocytes Absolute: 0.6 10*3/uL (ref 0.1–1.0)
Monocytes Relative: 7.3 % (ref 3.0–12.0)
Neutro Abs: 5.4 10*3/uL (ref 1.4–7.7)
Neutrophils Relative %: 64.9 % (ref 43.0–77.0)
Platelets: 313 10*3/uL (ref 150.0–400.0)
RBC: 5.16 Mil/uL (ref 4.22–5.81)
RDW: 13.3 % (ref 11.5–15.5)
WBC: 8.4 10*3/uL (ref 4.0–10.5)

## 2020-04-21 LAB — BASIC METABOLIC PANEL
BUN: 15 mg/dL (ref 6–23)
CO2: 29 mEq/L (ref 19–32)
Calcium: 9.5 mg/dL (ref 8.4–10.5)
Chloride: 104 mEq/L (ref 96–112)
Creatinine, Ser: 1.16 mg/dL (ref 0.40–1.50)
GFR: 69.08 mL/min (ref >60.00)
Glucose, Bld: 108 mg/dL — ABNORMAL HIGH (ref 70–99)
Potassium: 4.8 mEq/L (ref 3.5–5.1)
Sodium: 139 mEq/L (ref 135–145)

## 2020-04-21 LAB — TSH: TSH: 1.48 u[IU]/mL (ref 0.35–4.50)

## 2020-04-21 LAB — PSA: PSA: 4.12 ng/mL — ABNORMAL HIGH (ref 0.10–4.00)

## 2020-04-21 LAB — TESTOSTERONE: Testosterone: 63.68 ng/dL — ABNORMAL LOW (ref 300.00–890.00)

## 2020-04-21 MED ORDER — TESTOSTERONE CYPIONATE 200 MG/ML IM SOLN: INTRAMUSCULAR | 5 refills | Status: DC

## 2020-04-21 MED ORDER — "BD DISP NEEDLE 23G X 1"" MISC": 0 refills | Status: AC

## 2020-04-21 NOTE — Progress Notes (Signed)
Subjective:    Patient ID: [name], male    DOB: [birth year], 60 y.o.   MRN: [id number]  HPI Here for a well exam. He feels well.    Review of Systems  Constitutional: Negative.   HENT: Negative.   Eyes: Negative.   Respiratory: Negative.   Cardiovascular: Negative.   Gastrointestinal: Negative.   Genitourinary: Negative.   Musculoskeletal: Negative.   Skin: Negative.   Neurological: Negative.   Psychiatric/Behavioral: Negative.        Objective:   Physical Exam Constitutional:      General: He is not in acute distress.    Appearance: Normal appearance. He is well-developed and well-nourished. He is not diaphoretic.  HENT:     Head: Normocephalic and atraumatic.     Right Ear: External ear normal.     Left Ear: External ear normal.     Nose: Nose normal.     Mouth/Throat:     Mouth: Oropharynx is clear and moist.     Pharynx: No oropharyngeal exudate.  Eyes:     General: No scleral icterus.       Right eye: No discharge.        Left eye: No discharge.     Extraocular Movements: EOM normal.     Conjunctiva/sclera: Conjunctivae normal.     Pupils: Pupils are equal, round, and reactive to light.  Neck:     Thyroid: No thyromegaly.     Vascular: No JVD.     Trachea: No tracheal deviation.  Cardiovascular:     Rate and Rhythm: Normal rate and regular rhythm.     Pulses: Intact distal pulses.     Heart sounds: Normal heart sounds. No murmur heard. No friction rub. No gallop.   Pulmonary:     Effort: Pulmonary effort is normal. No respiratory distress.     Breath sounds: Normal breath sounds. No wheezing or rales.  Chest:     Chest wall: No tenderness.  Abdominal:     General: Bowel sounds are normal. There is no distension.     Palpations: Abdomen is soft. There is no mass.     Tenderness: There is no abdominal tenderness. There is no guarding or rebound.  Genitourinary:    Penis: Normal. No tenderness.      Prostate: Normal.     Rectum: Normal.  Guaiac result negative.  Musculoskeletal:        General: No tenderness or edema. Normal range of motion.     Cervical back: Neck supple.  Lymphadenopathy:     Cervical: No cervical adenopathy.  Skin:    General: Skin is warm and dry.     Coloration: Skin is not pale.     Findings: No erythema or rash.  Neurological:     Mental Status: He is alert and oriented to person, place, and time.     Cranial Nerves: No cranial nerve deficit.     Motor: No abnormal muscle tone.     Coordination: Coordination normal.     Deep Tendon Reflexes: Reflexes are normal and symmetric. Reflexes normal.  Psychiatric:        Mood and Affect: Mood and affect normal.        Behavior: Behavior normal.        Thought Content: Thought content normal.        Judgment: Judgment normal.           Assessment & Plan:  Well exam. We discussed diet and exercise.  Get fasting labs. Gershon Crane, MD

## 2020-05-02 ENCOUNTER — Other Ambulatory Visit: Payer: Self-pay | Admitting: Family Medicine

## 2020-07-12 ENCOUNTER — Encounter: Payer: Commercial Managed Care - PPO | Admitting: Gastroenterology

## 2020-09-01 ENCOUNTER — Other Ambulatory Visit: Payer: Self-pay | Admitting: Family Medicine

## 2020-09-07 ENCOUNTER — Other Ambulatory Visit: Payer: Self-pay | Admitting: Family Medicine

## 2020-09-11 ENCOUNTER — Other Ambulatory Visit: Payer: Self-pay | Admitting: Family Medicine

## 2020-09-12 NOTE — Telephone Encounter (Signed)
Pt LOV was 04/21/2020 Last refill done on 08/23/2019 (Lamictal 100 mg ) Please advise,  Rest of the prescription sent in

## 2020-10-08 ENCOUNTER — Other Ambulatory Visit: Payer: Self-pay | Admitting: Family Medicine

## 2020-12-05 ENCOUNTER — Ambulatory Visit: Payer: Commercial Managed Care - PPO | Admitting: Family Medicine

## 2020-12-05 ENCOUNTER — Other Ambulatory Visit: Payer: Self-pay

## 2020-12-05 ENCOUNTER — Encounter: Payer: Self-pay | Admitting: Family Medicine

## 2020-12-05 VITALS — BP 138/82 | HR 67 | Temp 98.6°F | Wt 193.0 lb

## 2020-12-05 DIAGNOSIS — R972 Elevated prostate specific antigen [PSA]: Secondary | ICD-10-CM

## 2020-12-05 DIAGNOSIS — E291 Testicular hypofunction: Secondary | ICD-10-CM

## 2020-12-05 DIAGNOSIS — L578 Other skin changes due to chronic exposure to nonionizing radiation: Secondary | ICD-10-CM | POA: Diagnosis not present

## 2020-12-05 LAB — PSA: PSA: 3.12 ng/mL (ref 0.10–4.00)

## 2020-12-05 LAB — TESTOSTERONE: Testosterone: 806.97 ng/dL (ref 300.00–890.00)

## 2020-12-05 MED ORDER — AZELASTINE HCL 0.1 % NA SOLN: NASAL | 11 refills | Status: DC

## 2020-12-05 MED ORDER — ALBUTEROL SULFATE HFA 108 (90 BASE) MCG/ACT IN AERS: INHALATION_SPRAY | RESPIRATORY_TRACT | 11 refills | Status: DC

## 2020-12-05 NOTE — Progress Notes (Signed)
   Subjective:    Patient ID: Andrew Swanson, male    DOB: [birth year], 60 y.o.   MRN: [id number]  HPI Here for several issues. First he ran out of testosterone a month ago and he needs refills. Second we did lab work in January showing a low testosterone level at 63 and a slightly high PSA at 4.12. He is here to have follow up labs done. Third he has had an itchy rash on both forearms for 3-4 months. He thought is was eczema and he has been applying cortisone creams to the areas, but this has not helped.    Review of Systems  Constitutional: Negative.   Respiratory: Negative.    Cardiovascular: Negative.   Skin:  Positive for rash.      Objective:   Physical Exam Constitutional:      Appearance: Normal appearance.  Cardiovascular:     Rate and Rhythm: Normal rate and regular rhythm.     Pulses: Normal pulses.     Heart sounds: Normal heart sounds.  Pulmonary:     Effort: Pulmonary effort is normal.     Breath sounds: Normal breath sounds.  Skin:    Comments: The dorsal surfaces of both forearms show widespread areas of raised, scaly, erythematous skin  Neurological:     Mental Status: He is alert.          Assessment & Plan:  For the hypogonadism and elevated PSA, we will get repeat levels today. Refill the testosterone. His forearms have a lot of actinic damage so we will refer him to Dermatology. I cautioned him to avoid the sun or at least use sunblock.  Gershon Crane, MD

## 2020-12-07 MED ORDER — TESTOSTERONE CYPIONATE 200 MG/ML IM SOLN: INTRAMUSCULAR | 5 refills | Status: DC

## 2020-12-07 NOTE — Telephone Encounter (Signed)
I just sent in the refills  

## 2020-12-13 ENCOUNTER — Other Ambulatory Visit: Payer: Self-pay | Admitting: Family Medicine

## 2021-03-14 ENCOUNTER — Other Ambulatory Visit: Payer: Self-pay | Admitting: Family Medicine

## 2021-03-28 ENCOUNTER — Telehealth: Payer: Commercial Managed Care - PPO | Admitting: Physician Assistant

## 2021-03-28 DIAGNOSIS — J209 Acute bronchitis, unspecified: Secondary | ICD-10-CM | POA: Diagnosis not present

## 2021-03-28 DIAGNOSIS — J440 Chronic obstructive pulmonary disease with acute lower respiratory infection: Secondary | ICD-10-CM

## 2021-03-28 MED ORDER — PREDNISONE 10 MG (21) PO TBPK: ORAL_TABLET | ORAL | 0 refills | Status: DC

## 2021-03-28 MED ORDER — BENZONATATE 100 MG PO CAPS: 100.0000 mg | ORAL_CAPSULE | Freq: Three times a day (TID) | ORAL | 0 refills | Status: DC | PRN

## 2021-03-28 NOTE — Progress Notes (Signed)
We are sorry that you are not feeling well.  Here is how we plan to help! ° °Based on your presentation I believe you most likely have A cough due to a virus.  This is called viral bronchitis and is best treated by rest, plenty of fluids and control of the cough.  You may use Ibuprofen or Tylenol as directed to help your symptoms.   °  °In addition you may use A prescription cough medication called Tessalon Perles 100mg. You may take 1-2 capsules every 8 hours as needed for your cough. ° °Prednisone 10 mg daily for 6 days (see taper instructions below) ° °Directions for 6 day taper: °Day 1: 2 tablets before breakfast, 1 after both lunch & dinner and 2 at bedtime °Day 2: 1 tab before breakfast, 1 after both lunch & dinner and 2 at bedtime °Day 3: 1 tab at each meal & 1 at bedtime °Day 4: 1 tab at breakfast, 1 at lunch, 1 at bedtime °Day 5: 1 tab at breakfast & 1 tab at bedtime °Day 6: 1 tab at breakfast ° °From your responses in the eVisit questionnaire you describe inflammation in the upper respiratory tract which is causing a significant cough.  This is commonly called Bronchitis and has four common causes:   °Allergies °Viral Infections °Acid Reflux °Bacterial Infection °Allergies, viruses and acid reflux are treated by controlling symptoms or eliminating the cause. An example might be a cough caused by taking certain blood pressure medications. You stop the cough by changing the medication. Another example might be a cough caused by acid reflux. Controlling the reflux helps control the cough. ° °USE OF BRONCHODILATOR ("RESCUE") INHALERS: °There is a risk from using your bronchodilator too frequently.  The risk is that over-reliance on a medication which only relaxes the muscles surrounding the breathing tubes can reduce the effectiveness of medications prescribed to reduce swelling and congestion of the tubes themselves.  Although you feel brief relief from the bronchodilator inhaler, your asthma may actually be  worsening with the tubes becoming more swollen and filled with mucus.  This can delay other crucial treatments, such as oral steroid medications. If you need to use a bronchodilator inhaler daily, several times per day, you should discuss this with your provider.  There are probably better treatments that could be used to keep your asthma under control.  °   °HOME CARE °Only take medications as instructed by your medical team. °Complete the entire course of an antibiotic. °Drink plenty of fluids and get plenty of rest. °Avoid close contacts especially the very young and the elderly °Cover your mouth if you cough or cough into your sleeve. °Always remember to wash your hands °A steam or ultrasonic humidifier can help congestion.  ° °GET HELP RIGHT AWAY IF: °You develop worsening fever. °You become short of breath °You cough up blood. °Your symptoms persist after you have completed your treatment plan °MAKE SURE YOU  °Understand these instructions. °Will watch your condition. °Will get help right away if you are not doing well or get worse. °  ° °Thank you for choosing an e-visit. ° °Your e-visit answers were reviewed by a board certified advanced clinical practitioner to complete your personal care plan. Depending upon the condition, your plan could have included both over the counter or prescription medications. ° °Please review your pharmacy choice. Make sure the pharmacy is open so you can pick up prescription now. If there is a problem, you may contact your provider   through MyChart messaging and have the prescription routed to another pharmacy.  Your safety is important to us. If you have drug allergies check your prescription carefully.  ° °For the next 24 hours you can use MyChart to ask questions about today's visit, request a non-urgent call back, or ask for a work or school excuse. °You will get an email in the next two days asking about your experience. I hope that your e-visit has been valuable and will  speed your recovery. ° °I provided 5 minutes of non face-to-face time during this encounter for chart review and documentation.  ° °

## 2021-06-14 ENCOUNTER — Other Ambulatory Visit: Payer: Self-pay | Admitting: Family Medicine

## 2021-06-15 NOTE — Telephone Encounter (Signed)
Last OV- 12/05/2020 ?Last refill- 12/07/20 ? ?No future OV scheduled ?

## 2021-06-28 ENCOUNTER — Other Ambulatory Visit: Payer: Self-pay | Admitting: Family Medicine

## 2021-06-29 ENCOUNTER — Telehealth: Payer: Commercial Managed Care - PPO | Admitting: Physician Assistant

## 2021-06-29 DIAGNOSIS — J111 Influenza due to unidentified influenza virus with other respiratory manifestations: Secondary | ICD-10-CM

## 2021-06-29 MED ORDER — OSELTAMIVIR PHOSPHATE 75 MG PO CAPS
75.0000 mg | ORAL_CAPSULE | Freq: Two times a day (BID) | ORAL | 0 refills | Status: DC
Start: 2021-06-29 — End: 2021-08-22

## 2021-06-29 NOTE — Progress Notes (Signed)
E visit for Flu like symptoms   We are sorry that you are not feeling well.  Here is how we plan to help! Based on what you have shared with me it looks like you may have a respiratory virus that may be influenza.  Influenza or "the flu" is   an infection caused by a respiratory virus. The flu virus is highly contagious and persons who did not receive their yearly flu vaccination may "catch" the flu from close contact.  We have anti-viral medications to treat the viruses that cause this infection. They are not a "cure" and only shorten the course of the infection. These prescriptions are most effective when they are given within the first 2 days of "flu" symptoms. Antiviral medication are indicated if you have a high risk of complications from the flu. You should  also consider an antiviral medication if you are in close contact with someone who is at risk. These medications can help patients avoid complications from the flu  but have side effects that you should know. Possible side effects from Tamiflu or oseltamivir include nausea, vomiting, diarrhea, dizziness, headaches, eye redness, sleep problems or other respiratory symptoms. You should not take Tamiflu if you have an allergy to oseltamivir or any to the ingredients in Tamiflu.  Based upon your symptoms and potential risk factors I have prescribed Oseltamivir (Tamiflu).  It has been sent to your designated pharmacy.  You will take one 75 mg capsule orally twice a day for the next 5 days.  ANYONE WHO HAS FLU SYMPTOMS SHOULD: Stay home. The flu is highly contagious and going out or to work exposes others! Be sure to drink plenty of fluids. Water is fine as well as fruit juices, sodas and electrolyte beverages. You may want to stay away from caffeine or alcohol. If you are nauseated, try taking small sips of liquids. How do you know if you are getting enough fluid? Your urine should be a pale yellow or almost colorless. Get rest. Taking a steamy  shower or using a humidifier may help nasal congestion and ease sore throat pain. Using a saline nasal spray works much the same way. Cough drops, hard candies and sore throat lozenges may ease your cough. Line up a caregiver. Have someone check on you regularly.   GET HELP RIGHT AWAY IF: You cannot keep down liquids or your medications. You become short of breath Your fell like you are going to pass out or loose consciousness. Your symptoms persist after you have completed your treatment plan MAKE SURE YOU  Understand these instructions. Will watch your condition. Will get help right away if you are not doing well or get worse.  Your e-visit answers were reviewed by a board certified advanced clinical practitioner to complete your personal care plan.  Depending on the condition, your plan could have included both over the counter or prescription medications.  If there is a problem please reply  once you have received a response from your provider.  Your safety is important to us.  If you have drug allergies check your prescription carefully.    You can use MyChart to ask questions about today's visit, request a non-urgent call back, or ask for a work or school excuse for 24 hours related to this e-Visit. If it has been greater than 24 hours you will need to follow up with your provider, or enter a new e-Visit to address those concerns.  You will get an e-mail in the next   two days asking about your experience.  I hope that your e-visit has been valuable and will speed your recovery. Thank you for using e-visits.  I provided 5 minutes of non face-to-face time during this encounter for chart review and documentation.   

## 2021-07-12 ENCOUNTER — Encounter (HOSPITAL_BASED_OUTPATIENT_CLINIC_OR_DEPARTMENT_OTHER): Payer: Self-pay

## 2021-07-12 ENCOUNTER — Other Ambulatory Visit: Payer: Self-pay

## 2021-07-12 DIAGNOSIS — T887XXA Unspecified adverse effect of drug or medicament, initial encounter: Secondary | ICD-10-CM | POA: Insufficient documentation

## 2021-07-12 DIAGNOSIS — Z23 Encounter for immunization: Secondary | ICD-10-CM | POA: Insufficient documentation

## 2021-07-12 DIAGNOSIS — M79605 Pain in left leg: Secondary | ICD-10-CM | POA: Insufficient documentation

## 2021-07-12 DIAGNOSIS — T387X5A Adverse effect of androgens and anabolic congeners, initial encounter: Secondary | ICD-10-CM | POA: Diagnosis not present

## 2021-07-12 NOTE — ED Triage Notes (Signed)
Pt presents POV with his wife. ? ?Pt reports he gave himself a testosterone injection to his left calf on Tuesday night. ? ?Since then left calf has been red and painful and difficult to walk on. ? ?Left calf red and warm to touch. ? ?Pt limps into triage, in NAD. ?

## 2021-07-13 ENCOUNTER — Emergency Department (HOSPITAL_BASED_OUTPATIENT_CLINIC_OR_DEPARTMENT_OTHER): Payer: Commercial Managed Care - PPO

## 2021-07-13 ENCOUNTER — Encounter (HOSPITAL_BASED_OUTPATIENT_CLINIC_OR_DEPARTMENT_OTHER): Payer: Self-pay | Admitting: Emergency Medicine

## 2021-07-13 ENCOUNTER — Emergency Department (HOSPITAL_BASED_OUTPATIENT_CLINIC_OR_DEPARTMENT_OTHER)
Admission: EM | Admit: 2021-07-13 | Discharge: 2021-07-13 | Disposition: A | Discharge status: Home / Self Care | Payer: Commercial Managed Care - PPO | Attending: Emergency Medicine | Admitting: Emergency Medicine

## 2021-07-13 DIAGNOSIS — T50905A Adverse effect of unspecified drugs, medicaments and biological substances, initial encounter: Secondary | ICD-10-CM

## 2021-07-13 DIAGNOSIS — L089 Local infection of the skin and subcutaneous tissue, unspecified: Secondary | ICD-10-CM

## 2021-07-13 MED ORDER — DOXYCYCLINE HYCLATE 100 MG PO TABS
100.0000 mg | ORAL_TABLET | Freq: Once | ORAL | Status: AC
  Administered 2021-07-13: 100 mg via ORAL
  Filled 2021-07-13: qty 1

## 2021-07-13 MED ORDER — DOXYCYCLINE HYCLATE 100 MG PO CAPS: 100.0000 mg | ORAL_CAPSULE | Freq: Two times a day (BID) | ORAL | 0 refills | Status: DC

## 2021-07-13 MED ORDER — IBUPROFEN 800 MG PO TABS
800.0000 mg | ORAL_TABLET | Freq: Once | ORAL | Status: AC
Start: 2021-07-13 — End: 2021-07-13
  Administered 2021-07-13: 800 mg via ORAL
  Filled 2021-07-13: qty 1

## 2021-07-13 MED ORDER — TETANUS-DIPHTH-ACELL PERTUSSIS 5-2.5-18.5 LF-MCG/0.5 IM SUSY
0.5000 mL | PREFILLED_SYRINGE | Freq: Once | INTRAMUSCULAR | Status: AC
  Administered 2021-07-13: 0.5 mL via INTRAMUSCULAR
  Filled 2021-07-13: qty 0.5

## 2021-07-13 NOTE — ED Notes (Signed)
Ice applied to LLE

## 2021-07-13 NOTE — ED Provider Notes (Signed)
?[facility] ?Provider Note ? ? ?CSN: [id number] ?Arrival date & time: 07/12/21  2321 ? ?  ? ?History ? ?Chief Complaint  ?Patient presents with  ? Leg Pain  ?  Left ?  ? ? ?Andrew Swanson is a 61 y.o. male. ? ?The history is provided by the patient.  ?Leg Pain ?Location:  Leg ?Time since incident:  3 days ?Injury: yes   ?Mechanism of injury comment:  Injected testosteron into the calf muscle Tuesday ?Leg location:  R lower leg ?Pain details:  ?  Quality:  Aching ?  Radiates to:  Does not radiate ?  Severity:  Moderate ?  Onset quality:  Gradual ?  Duration:  3 days ?  Timing:  Constant ?  Progression:  Unchanged ?Chronicity:  New ?Dislocation: no   ?Foreign body present:  No foreign bodies ?Tetanus status:  Out of date ?Prior injury to area:  No ?Relieved by:  Nothing ?Worsened by:  Nothing ?Ineffective treatments:  None tried ?Associated symptoms: swelling   ?Associated symptoms: no back pain, no fatigue, no fever and no tingling   ?Risk factors: no concern for non-accidental trauma   ?Patient is supposed to inject testosterone into the buttocks but decided to inject the L calf muscle on Tuesday.  Now it swollen and warm.   ?  ? ?Home Medications ?Prior to Admission medications   ?Medication Sig Start Date End Date Taking? Authorizing Provider  ?albuterol (PROAIR HFA) 108 (90 Base) MCG/ACT inhaler INHALE 2 PUFFS BY MOUTH EVERY 4 HOURS AS NEEDED FOR WHEEZE OR SHORTNESS OF BREATH 12/05/20   Nelwyn SalisburyFry, Stephen A, MD  ?azelastine (ASTELIN) 0.1 % nasal spray USE 1 SPRAY IN EACH NOSTRIL EVERY DAY AS DIRECTED 12/05/20   Nelwyn SalisburyFry, Stephen A, MD  ?B-D SYRINGE LUER-LOK 3CC 3 ML MISC USE AS DIRECTED 07/26/19   Nelwyn SalisburyFry, Stephen A, MD  ?BD DISP NEEDLES 22G X 1-1/2" MISC USE AS DIRECTED 07/15/19   Nelwyn SalisburyFry, Stephen A, MD  ?benzonatate (TESSALON) 100 MG capsule Take 1 capsule (100 mg total) by mouth 3 (three) times daily as needed. 03/28/21   Margaretann LovelessBurnette, Jennifer M, PA-C  ?budesonide-formoterol (SYMBICORT) 160-4.5 MCG/ACT  inhaler INHALE 2 PUFFS INTO THE LUNGS TWICE A DAY 06/28/21   Nelwyn SalisburyFry, Stephen A, MD  ?buPROPion (WELLBUTRIN XL) 300 MG 24 hr tablet TAKE 1 TABLET BY MOUTH EVERY DAY 10/09/20   Nelwyn SalisburyFry, Stephen A, MD  ?lamoTRIgine (LAMICTAL) 100 MG tablet TAKE 1 TABLET BY MOUTH TWICE A DAY 09/13/20   Nelwyn SalisburyFry, Stephen A, MD  ?losartan-hydrochlorothiazide [facility](HYZAAR) 100-25 MG tablet TAKE 1 TABLET BY MOUTH EVERY DAY 05/03/20   Nelwyn SalisburyFry, Stephen A, MD  ?montelukast (SINGULAIR) 10 MG tablet TAKE 1 TABLET BY MOUTH EVERYDAY AT BEDTIME 09/12/20   Nelwyn SalisburyFry, Stephen A, MD  ?NEEDLE, DISP, 23 G (BD DISP NEEDLE) 23G X 1" MISC USE AS DIRECTED 04/21/20   Nelwyn SalisburyFry, Stephen A, MD  ?omeprazole (PRILOSEC) 40 MG capsule TAKE 1 CAPSULE BY MOUTH EVERY DAY 09/12/20   Nelwyn SalisburyFry, Stephen A, MD  ?oseltamivir (TAMIFLU) 75 MG capsule Take 1 capsule (75 mg total) by mouth 2 (two) times daily. 06/29/21   Margaretann LovelessBurnette, Jennifer M, PA-C  ?predniSONE (STERAPRED UNI-PAK 21 TAB) 10 MG (21) TBPK tablet 6 day taper; take as directed on package instructions 03/28/21   Margaretann LovelessBurnette, Jennifer M, PA-C  ?rosuvastatin (CRESTOR) 10 MG tablet TAKE 1 TABLET BY MOUTH EVERY DAY 09/12/20   Nelwyn SalisburyFry, Stephen A, MD  ?tadalafil (CIALIS) 5 MG tablet AS NEEDED 03/14/21  Nelwyn Salisbury, MD  ?testosterone cypionate (DEPOTESTOSTERONE CYPIONATE) 200 MG/ML injection ADMINISTER 1 ML IN THE MUSCLE 1 TIME WEEKLY AS DIRECTED 06/15/21   Nelwyn Salisbury, MD  ?   ? ?Allergies    ?Eggs or egg-derived products and Penicillins   ? ?Review of Systems   ?Review of Systems  ?Constitutional:  Negative for fatigue and fever.  ?HENT:  Negative for facial swelling.   ?Eyes:  Negative for redness.  ?Respiratory:  Negative for wheezing and stridor.   ?Cardiovascular:  Negative for chest pain.  ?Gastrointestinal:  Negative for abdominal pain.  ?Genitourinary:  Negative for difficulty urinating.  ?Musculoskeletal:  Negative for back pain.  ?Skin:  Positive for color change.  ?Neurological:  Negative for facial asymmetry.  ?All other systems reviewed and are  negative. ? ?Physical Exam ?Updated Vital Signs ?BP (!) 150/91 (BP Location: Right Arm)   Pulse 82   Temp 98.7 ?F (37.1 ?C) (Oral)   Resp 16   Ht 5\' 8"  (1.727 m)   Wt 84.4 kg   SpO2 100%   BMI 28.28 kg/m?  ?Physical Exam ?Vitals and nursing note reviewed.  ?Constitutional:   ?   Appearance: Normal appearance.  ?HENT:  ?   Head: Normocephalic and atraumatic.  ?Eyes:  ?   Conjunctiva/sclera: Conjunctivae normal.  ?   Pupils: Pupils are equal, round, and reactive to light.  ?Cardiovascular:  ?   Rate and Rhythm: Normal rate and regular rhythm.  ?   Pulses: Normal pulses.  ?   Heart sounds: Normal heart sounds.  ?Pulmonary:  ?   Effort: Pulmonary effort is normal.  ?   Breath sounds: Normal breath sounds.  ?Abdominal:  ?   General: Abdomen is flat. Bowel sounds are normal.  ?   Palpations: Abdomen is soft.  ?   Tenderness: There is no abdominal tenderness. There is no guarding.  ?Musculoskeletal:  ?   Cervical back: Normal range of motion and neck supple.  ?     Legs: ? ?Skin: ?   General: Skin is warm and dry.  ?   Capillary Refill: Capillary refill takes less than 2 seconds.  ?Neurological:  ?   General: No focal deficit present.  ?   Mental Status: He is alert and oriented to person, place, and time.  ?   Deep Tendon Reflexes: Reflexes normal.  ?Psychiatric:     ?   Mood and Affect: Mood normal.     ?   Behavior: Behavior normal.  ? ? ?ED Results / Procedures / Treatments   ?Labs ?(all labs ordered are listed, but only abnormal results are displayed) ?Labs Reviewed - No data to display ? ?EKG ?None ? ?Radiology ? Venous Img Lower Unilateral Left (DVT) ? ?Result Date: 07/13/2021 ?CLINICAL DATA:  Initial evaluation for acute left calf pain. EXAM: LEFT LOWER EXTREMITY VENOUS DOPPLER ULTRASOUND TECHNIQUE: Gray-scale sonography with graded compression, as well as color Doppler and duplex ultrasound were performed to evaluate the lower extremity deep venous systems from the level of the common femoral vein and  including the common femoral, femoral, profunda femoral, popliteal and calf veins including the posterior tibial, peroneal and gastrocnemius veins when visible. The superficial great saphenous vein was also interrogated. Spectral Doppler was utilized to evaluate flow at rest and with distal augmentation maneuvers in the common femoral, femoral and popliteal veins. COMPARISON:  None. FINDINGS: Contralateral Common Femoral Vein: Respiratory phasicity is normal and symmetric with the symptomatic side. No evidence of thrombus.  Normal compressibility. Common Femoral Vein: No evidence of thrombus. Normal compressibility, respiratory phasicity and response to augmentation. Saphenofemoral Junction: No evidence of thrombus. Normal compressibility and flow on color Doppler imaging. Profunda Femoral Vein: No evidence of thrombus. Normal compressibility and flow on color Doppler imaging. Femoral Vein: No evidence of thrombus. Normal compressibility, respiratory phasicity and response to augmentation. Popliteal Vein: No evidence of thrombus. Normal compressibility, respiratory phasicity and response to augmentation. Calf Veins: No evidence of thrombus. Normal compressibility and flow on color Doppler imaging. Superficial Great Saphenous Vein: No evidence of thrombus. Normal compressibility. Venous Reflux:  None. Other Findings: Diffuse soft tissue/interstitial edema within the subcutaneous soft tissues of the left calf. IMPRESSION: 1. No evidence of deep venous thrombosis. 2. Diffuse soft tissue/interstitial edema within the subcutaneous soft tissues of the left calf. Electronically Signed   By: Rise Mu M.D.   On: 07/13/2021 02:41   ? ?Procedures ?Procedures  ? ? ?Medications Ordered in ED ?Medications  ?doxycycline (VIBRA-TABS) tablet 100 mg (has no administration in time range)  ?ibuprofen (ADVIL) tablet 800 mg (has no administration in time range)  ?Tdap (BOOSTRIX) injection 0.5 mL (has no administration in time  range)  ? ? ?ED Course/ Medical Decision Making/ A&P ?  ?                        ?Medical Decision Making ?Redness and warmth of the left calf following injecting testosterone ? ?Amount and/or Complexity of Data Reviewed ?Independe

## 2021-07-25 ENCOUNTER — Telehealth: Payer: Commercial Managed Care - PPO | Admitting: Family Medicine

## 2021-07-25 DIAGNOSIS — J069 Acute upper respiratory infection, unspecified: Secondary | ICD-10-CM

## 2021-07-26 MED ORDER — BENZONATATE 100 MG PO CAPS: 100.0000 mg | ORAL_CAPSULE | Freq: Two times a day (BID) | ORAL | 0 refills | Status: DC | PRN

## 2021-07-26 MED ORDER — FLUTICASONE PROPIONATE 50 MCG/ACT NA SUSP: 2.0000 | Freq: Every day | NASAL | 0 refills | Status: DC

## 2021-07-26 NOTE — Progress Notes (Signed)

## 2021-08-16 ENCOUNTER — Other Ambulatory Visit: Payer: Self-pay | Admitting: Family Medicine

## 2021-08-16 NOTE — Telephone Encounter (Signed)
Pt needs appointment for further refills 

## 2021-08-22 ENCOUNTER — Encounter: Payer: Self-pay | Admitting: Family Medicine

## 2021-08-22 ENCOUNTER — Ambulatory Visit: Payer: Commercial Managed Care - PPO | Admitting: Family Medicine

## 2021-08-22 VITALS — BP 164/100 | HR 77 | Temp 98.4°F | Ht 68.0 in | Wt 190.6 lb

## 2021-08-22 DIAGNOSIS — J40 Bronchitis, not specified as acute or chronic: Secondary | ICD-10-CM | POA: Diagnosis not present

## 2021-08-22 DIAGNOSIS — H6692 Otitis media, unspecified, left ear: Secondary | ICD-10-CM

## 2021-08-22 MED ORDER — CEFUROXIME AXETIL 500 MG PO TABS: 500.0000 mg | ORAL_TABLET | Freq: Two times a day (BID) | ORAL | 0 refills | Status: AC

## 2021-08-22 NOTE — Progress Notes (Signed)
? ?  Subjective:  ? ? Patient ID: Andrew Swanson, male    DOB: Feb 04, 1961, 61 y.o.   MRN: [id number] ? ?HPI ?Here for 10 days of stuffy head, PND, chest congestion and coughing up brown sputum. Now for the past 7 days he has also had pain in th left ear. No fever. Taking Mucinex DM and using his albuterol inhaler.  ? ? ?Review of Systems  ?Constitutional: Negative.   ?HENT:  Positive for congestion, ear pain, postnasal drip and sinus pressure. Negative for sore throat.   ?Eyes: Negative.   ?Respiratory:  Positive for cough, chest tightness, shortness of breath and wheezing.   ? ?   ?Objective:  ? Physical Exam ?Constitutional:   ?   Appearance: Normal appearance. He is not ill-appearing.  ?HENT:  ?   Right Ear: Tympanic membrane, ear canal and external ear normal.  ?   Left Ear: External ear normal.  ?   Ears:  ?   Comments: Left TM is red and dull  ?   Nose: Nose normal.  ?   Mouth/Throat:  ?   Pharynx: Oropharynx is clear.  ?Eyes:  ?   Conjunctiva/sclera: Conjunctivae normal.  ?Pulmonary:  ?   Breath sounds: No rales.  ?   Comments: Diffuse rhonchi and wheezes  ?Lymphadenopathy:  ?   Cervical: No cervical adenopathy.  ?Neurological:  ?   Mental Status: He is alert.  ? ? ? ? ? ?   ?Assessment & Plan:  ?He has a bronchitis and a left otitis media. Treat both with 10 days of Cefuroxime. Recheck as needed.  ?Gershon Crane, MD ? ? ?

## 2021-09-03 ENCOUNTER — Encounter: Payer: Self-pay | Admitting: Family Medicine

## 2021-09-03 ENCOUNTER — Telehealth: Payer: Commercial Managed Care - PPO | Admitting: Nurse Practitioner

## 2021-09-03 DIAGNOSIS — J209 Acute bronchitis, unspecified: Secondary | ICD-10-CM

## 2021-09-03 DIAGNOSIS — J440 Chronic obstructive pulmonary disease with acute lower respiratory infection: Secondary | ICD-10-CM | POA: Diagnosis not present

## 2021-09-03 MED ORDER — AZITHROMYCIN 250 MG PO TABS: ORAL_TABLET | ORAL | 0 refills | Status: AC

## 2021-09-03 MED ORDER — BENZONATATE 100 MG PO CAPS: 100.0000 mg | ORAL_CAPSULE | Freq: Three times a day (TID) | ORAL | 0 refills | Status: DC | PRN

## 2021-09-03 MED ORDER — PREDNISONE 20 MG PO TABS: 20.0000 mg | ORAL_TABLET | Freq: Every day | ORAL | 0 refills | Status: AC

## 2021-09-03 NOTE — Progress Notes (Signed)
We are sorry that you are not feeling well.  Here is how we plan to help!  Based on your presentation I believe you most likely have A cough due to bacteria.  When patients have a fever and a productive cough with a change in color or increased sputum production, we are concerned about bacterial bronchitis.  If left untreated it can progress to pneumonia.  If your symptoms do not improve with your treatment plan it is important that you contact your provider.   I have prescribed Azithromyin 250 mg: two tablets now and then one tablet daily for 4 additonal days    In addition you may use A prescription cough medication called Tessalon Perles '100mg'$ . You may take 1-2 capsules every 8 hours as needed for your cough.  Prednisone 20 mg daily for 5 days  From your responses in the eVisit questionnaire you describe inflammation in the upper respiratory tract which is causing a significant cough.  This is commonly called Bronchitis and has four common causes:   Allergies Viral Infections Acid Reflux Bacterial Infection Allergies, viruses and acid reflux are treated by controlling symptoms or eliminating the cause. An example might be a cough caused by taking certain blood pressure medications. You stop the cough by changing the medication. Another example might be a cough caused by acid reflux. Controlling the reflux helps control the cough.  USE OF BRONCHODILATOR ("RESCUE") INHALERS: There is a risk from using your bronchodilator too frequently.  The risk is that over-reliance on a medication which only relaxes the muscles surrounding the breathing tubes can reduce the effectiveness of medications prescribed to reduce swelling and congestion of the tubes themselves.  Although you feel brief relief from the bronchodilator inhaler, your asthma may actually be worsening with the tubes becoming more swollen and filled with mucus.  This can delay other crucial treatments, such as oral steroid medications. If you  need to use a bronchodilator inhaler daily, several times per day, you should discuss this with your provider.  There are probably better treatments that could be used to keep your asthma under control.     HOME CARE Only take medications as instructed by your medical team. Complete the entire course of an antibiotic. Drink plenty of fluids and get plenty of rest. Avoid close contacts especially the very young and the elderly Cover your mouth if you cough or cough into your sleeve. Always remember to wash your hands A steam or ultrasonic humidifier can help congestion.   GET HELP RIGHT AWAY IF: You develop worsening fever. You become short of breath You cough up blood. Your symptoms persist after you have completed your treatment plan MAKE SURE YOU  Understand these instructions. Will watch your condition. Will get help right away if you are not doing well or get worse.    Thank you for choosing an e-visit.  Your e-visit answers were reviewed by a board certified advanced clinical practitioner to complete your personal care plan. Depending upon the condition, your plan could have included both over the counter or prescription medications.  Please review your pharmacy choice. Make sure the pharmacy is open so you can pick up prescription now. If there is a problem, you may contact your provider through [employer] and have the prescription routed to another pharmacy.  Your safety is important to [place]. If you have drug allergies check your prescription carefully.   For the next 24 hours you can use MyChart to ask questions about today's visit,  request a non-urgent call back, or ask for a work or school excuse. You will get an email in the next two days asking about your experience. I hope that your e-visit has been valuable and will speed your recovery.   I spent approximately 7 minutes reviewing the patient's history, current symptoms and coordinating their plan of care today.     Meds ordered this encounter  Medications   azithromycin (ZITHROMAX) 250 MG tablet    Sig: Take 2 tablets on day 1, then 1 tablet daily on days 2 through 5    Dispense:  6 tablet    Refill:  0   predniSONE (DELTASONE) 20 MG tablet    Sig: Take 1 tablet (20 mg total) by mouth daily with breakfast for 5 days.    Dispense:  5 tablet    Refill:  0   benzonatate (TESSALON) 100 MG capsule    Sig: Take 1 capsule (100 mg total) by mouth 3 (three) times daily as needed.    Dispense:  30 capsule    Refill:  0

## 2021-09-04 ENCOUNTER — Other Ambulatory Visit: Payer: Self-pay

## 2021-09-04 DIAGNOSIS — H6692 Otitis media, unspecified, left ear: Secondary | ICD-10-CM

## 2021-09-04 MED ORDER — DOXYCYCLINE HYCLATE 100 MG PO TABS: ORAL_TABLET | ORAL | 0 refills | Status: DC

## 2021-09-04 NOTE — Telephone Encounter (Signed)
Call in Doxycycline 100 mg BID for 10 days  

## 2021-10-05 ENCOUNTER — Other Ambulatory Visit: Payer: Self-pay | Admitting: Family Medicine

## 2021-10-05 ENCOUNTER — Telehealth: Payer: Commercial Managed Care - PPO | Admitting: Physician Assistant

## 2021-10-05 DIAGNOSIS — J069 Acute upper respiratory infection, unspecified: Secondary | ICD-10-CM

## 2021-10-05 MED ORDER — IPRATROPIUM BROMIDE 0.03 % NA SOLN: 2.0000 | Freq: Two times a day (BID) | NASAL | 0 refills | Status: DC

## 2021-10-05 MED ORDER — PROMETHAZINE-DM 6.25-15 MG/5ML PO SYRP: 5.0000 mL | ORAL_SOLUTION | Freq: Four times a day (QID) | ORAL | 0 refills | Status: DC | PRN

## 2021-10-05 MED ORDER — PREDNISONE 20 MG PO TABS: 40.0000 mg | ORAL_TABLET | Freq: Every day | ORAL | 0 refills | Status: DC

## 2021-10-10 ENCOUNTER — Other Ambulatory Visit: Payer: Self-pay | Admitting: Family Medicine

## 2021-10-11 ENCOUNTER — Other Ambulatory Visit: Payer: Self-pay | Admitting: Family Medicine

## 2021-10-27 ENCOUNTER — Other Ambulatory Visit: Payer: Self-pay | Admitting: Family Medicine

## 2021-11-01 ENCOUNTER — Other Ambulatory Visit: Payer: Self-pay | Admitting: Family Medicine

## 2021-11-28 ENCOUNTER — Other Ambulatory Visit: Payer: Self-pay | Admitting: Family Medicine

## 2021-12-05 ENCOUNTER — Other Ambulatory Visit: Payer: Self-pay | Admitting: Family Medicine

## 2021-12-18 ENCOUNTER — Telehealth: Payer: Commercial Managed Care - PPO | Admitting: Nurse Practitioner

## 2021-12-18 DIAGNOSIS — R051 Acute cough: Secondary | ICD-10-CM | POA: Diagnosis not present

## 2021-12-18 MED ORDER — BENZONATATE 100 MG PO CAPS: 100.0000 mg | ORAL_CAPSULE | Freq: Three times a day (TID) | ORAL | 0 refills | Status: DC | PRN

## 2021-12-18 MED ORDER — PREDNISONE 10 MG PO TABS: 10.0000 mg | ORAL_TABLET | Freq: Two times a day (BID) | ORAL | 0 refills | Status: AC

## 2021-12-18 NOTE — Progress Notes (Signed)
Andrew Swanson,  After reviewing your chart we notes that you have been seen several times with cough, upper respiratory tract symptoms and bronchitis. I see that in the past you were seen by pulmonology for management of COPD/Asthma. Since these symptoms seem to be occurring more often our best advice is for you to schedule a follow up with the [facility] or your primary care. We will give directions for management of your acute symptoms, but we want to be sure also that your chronic condition is being monitored as well.   Please also continue to use your inhalers as directed, if you have questions about that or are out of your inhalers please let us know   Based on your presentation I believe you most likely have A cough due to a virus.  This is called viral bronchitis and is best treated by rest, plenty of fluids and control of the cough.  You may use Ibuprofen or Tylenol as directed to help your symptoms.     In addition you may use A prescription cough medication called Tessalon Perles 100mg . You may take 1-2 capsules every 8 hours as needed for your cough.  Prednisone 10 mg twice daily for 5 days   From your responses in the eVisit questionnaire you describe inflammation in the upper respiratory tract which is causing a significant cough.  This is commonly called Bronchitis and has four common causes:   Allergies Viral Infections Acid Reflux Bacterial Infection Allergies, viruses and acid reflux are treated by controlling symptoms or eliminating the cause. An example might be a cough caused by taking certain blood pressure medications. You stop the cough by changing the medication. Another example might be a cough caused by acid reflux. Controlling the reflux helps control the cough.  USE OF BRONCHODILATOR ("RESCUE") INHALERS: There is a risk from using your bronchodilator too frequently.  The risk is that over-reliance on a medication which only relaxes the muscles surrounding the  breathing tubes can reduce the effectiveness of medications prescribed to reduce swelling and congestion of the tubes themselves.  Although you feel brief relief from the bronchodilator inhaler, your asthma may actually be worsening with the tubes becoming more swollen and filled with mucus.  This can delay other crucial treatments, such as oral steroid medications. If you need to use a bronchodilator inhaler daily, several times per day, you should discuss this with your provider.  There are probably better treatments that could be used to keep your asthma under control.     HOME CARE Only take medications as instructed by your medical team. Complete the entire course of an antibiotic. Drink plenty of fluids and get plenty of rest. Avoid close contacts especially the very young and the elderly Cover your mouth if you cough or cough into your sleeve. Always remember to wash your hands A steam or ultrasonic humidifier can help congestion.   GET HELP RIGHT AWAY IF: You develop worsening fever. You become short of breath You cough up blood. Your symptoms persist after you have completed your treatment plan MAKE SURE YOU  Understand these instructions. Will watch your condition. Will get help right away if you are not doing well or get worse.    Thank you for choosing an e-visit.  Your e-visit answers were reviewed by a board certified advanced clinical practitioner to complete your personal care plan. Depending upon the condition, your plan could have included both over the counter or prescription medications.  Please review your pharmacy  choice. Make sure the pharmacy is open so you can pick up prescription now. If there is a problem, you may contact your provider through [employer] and have the prescription routed to another pharmacy.  Your safety is important to [place]. If you have drug allergies check your prescription carefully.   For the next 24 hours you can use MyChart to ask  questions about today's visit, request a non-urgent call back, or ask for a work or school excuse. You will get an email in the next two days asking about your experience. I hope that your e-visit has been valuable and will speed your recovery.   I spent approximately 7 minutes reviewing the patient's history, current symptoms and coordinating their plan of care today.    Meds ordered this encounter  Medications   predniSONE (DELTASONE) 10 MG tablet    Sig: Take 1 tablet (10 mg total) by mouth 2 (two) times daily with a meal for 5 days.    Dispense:  10 tablet    Refill:  0   benzonatate (TESSALON) 100 MG capsule    Sig: Take 1 capsule (100 mg total) by mouth 3 (three) times daily as needed.    Dispense:  30 capsule    Refill:  0

## 2021-12-19 ENCOUNTER — Other Ambulatory Visit: Payer: Self-pay | Admitting: Family Medicine

## 2021-12-29 ENCOUNTER — Other Ambulatory Visit: Payer: Self-pay | Admitting: Family Medicine

## 2022-01-02 ENCOUNTER — Other Ambulatory Visit: Payer: Self-pay | Admitting: Family Medicine

## 2022-01-03 ENCOUNTER — Other Ambulatory Visit: Payer: Self-pay | Admitting: Family Medicine

## 2022-01-04 ENCOUNTER — Other Ambulatory Visit: Payer: Self-pay

## 2022-01-04 MED ORDER — OMEPRAZOLE 40 MG PO CPDR
40.0000 mg | DELAYED_RELEASE_CAPSULE | Freq: Every day | ORAL | 1 refills | Status: DC
Start: 2022-01-04 — End: 2022-03-01

## 2022-01-04 NOTE — Telephone Encounter (Signed)
Last OV-08/22/21 Last labs-04/21/2020**  No future OV scheduled.   Please advise if I need to call patient to schedule lab appointment only.

## 2022-01-26 ENCOUNTER — Other Ambulatory Visit: Payer: Self-pay | Admitting: Family Medicine

## 2022-01-28 ENCOUNTER — Other Ambulatory Visit: Payer: Self-pay | Admitting: Family Medicine

## 2022-01-30 ENCOUNTER — Other Ambulatory Visit: Payer: Self-pay | Admitting: Family Medicine

## 2022-02-12 ENCOUNTER — Ambulatory Visit
Admission: RE | Admit: 2022-02-12 | Discharge: 2022-02-12 | Disposition: A | Discharge status: Home / Self Care | Payer: Commercial Managed Care - PPO | Source: Ambulatory Visit | Attending: Emergency Medicine | Admitting: Emergency Medicine

## 2022-02-12 VITALS — BP 149/81 | HR 62 | Temp 98.5°F | Resp 16

## 2022-02-12 DIAGNOSIS — L03116 Cellulitis of left lower limb: Secondary | ICD-10-CM

## 2022-02-12 MED ORDER — SULFAMETHOXAZOLE-TRIMETHOPRIM 800-160 MG PO TABS: 1.0000 | ORAL_TABLET | Freq: Two times a day (BID) | ORAL | 0 refills | Status: AC

## 2022-02-12 MED ORDER — "BD DISP NEEDLES 22G X 1-1/2"" MISC": 1.0000 | 0 refills | Status: DC

## 2022-02-12 NOTE — Discharge Instructions (Addendum)
To treat the infection in your leg around your injection site, please begin Bactrim 1 tablet twice daily for the next 5 days.  Please be sure that you always use a clean needle when injecting testosterone.  Please be sure that you also clean the area that you plan to inject with 2 separate alcohol swabs to make sure that any bacteria on your skin does not get injected into your skin when you insert the needle.  I have refilled your prescription for needles at your pharmacy.  If you do not see meaningful resolution of the redness, swelling and pain in your left leg after completing antibiotics, please return for further evaluation.  If you notice worsening of the redness, swelling and pain in your left leg while taking antibiotics, please go to the emergency room from more of treatment.  Thank you for visiting urgent care today.

## 2022-02-12 NOTE — ED Triage Notes (Addendum)
The patient c/o left upper left/ thigh swelling. The patient states while giving himself a at home injection he accidentally used his older/ used needle to give the injection. The patient states there is redness and inflammation to the area.   Started: Sunday

## 2022-02-12 NOTE — ED Provider Notes (Signed)
[facility]    CSN: [id number] Arrival date & time: 02/12/22  1714    HISTORY   Chief Complaint  Patient presents with   Leg Swelling    Entered by patient   injection site infection   HPI Andrew Swanson is a pleasant, 61 y.o. male who presents to urgent care today. Patient complains of tenderness, redness, swelling and warmth of his left upper thigh after injecting himself with his weekly dose of testosterone.  Patient states he reused a used needle for this injection, states he is concerned he may have an infection on his leg.  Patient states he performed the injection 2 days ago himself.  Patient denies fever, body aches, chills, abscess, drainage from injection site.  The history is provided by the patient.   Past Medical History:  Diagnosis Date   Allergy    Asthma    sees Dr Corinda Gubler   Chest pain    stress echo 2009 [facility]. MCh 5-19 11  MI ruled out  recuttent chest pain September 28 2009/catherization  October 06 2009 - mild to moderate nonobstructive disease...current pain is not cardiac.. normal LV CAD .Marland Kitchenmild nonobstructive  ..catherization June 2011.    Hypertension    Pneumonia    -06-2008   Patient Active Problem List   Diagnosis Date Noted   Actinic skin damage 12/05/2020   Tachycardia 09/28/2014   Asthma exacerbation 09/25/2014   Hypogonadism male 11/24/2013   Other and unspecified hyperlipidemia 11/24/2013   Bipolar disorder (HCC) 07/27/2013   Obesity, unspecified 11/13/2012   COPD GOLD I with ? asthma component 02/06/2012   CAD 10/26/2009   GERD 10/26/2009   CHEST PAIN 09/27/2009   PNEUMONIA 07/11/2008   ALLERGIC RHINITIS 01/16/2007   Essential hypertension 12/11/2006   ASTHMA 12/11/2006   Past Surgical History:  Procedure Laterality Date   cardiac catherization     10-06-09 Dr Myrtis Ser showed mild nonobstructive disease   CATARACT EXTRACTION, BILATERAL  03/2018   per Dr. Hazle Quant    Home Medications    Prior to Admission medications    Medication Sig Start Date End Date Taking? Authorizing Provider  sulfamethoxazole-trimethoprim (BACTRIM DS) 800-160 MG tablet Take 1 tablet by mouth 2 (two) times daily for 5 days. 02/12/22 02/17/22 Yes Theadora Rama Scales, PA-C  albuterol (VENTOLIN HFA) 108 (90 Base) MCG/ACT inhaler INHALE 2 PUFFS BY MOUTH EVERY 4 HOURS AS NEEDED FOR WHEEZE OR SHORTNESS OF BREATH 12/05/21   Nelwyn Salisbury, MD  Azelastine HCl 137 MCG/SPRAY SOLN USE 1 SPRAY IN EACH NOSTRIL EVERY DAY AS DIRECTED 12/05/21   Nelwyn Salisbury, MD  B-D SYRINGE LUER-LOK 3CC 3 ML MISC USE AS DIRECTED 07/26/19   Nelwyn Salisbury, MD  BD DISP NEEDLES 22G X 1-1/2" MISC USE AS DIRECTED 07/15/19   Nelwyn Salisbury, MD  benzonatate (TESSALON) 100 MG capsule Take 1 capsule (100 mg total) by mouth 3 (three) times daily as needed. 12/18/21   Viviano Simas, FNP  budesonide-formoterol (SYMBICORT) 160-4.5 MCG/ACT inhaler INHALE 2 PUFFS INTO THE LUNGS TWICE A DAY 01/29/22   Nelwyn Salisbury, MD  buPROPion (WELLBUTRIN XL) 300 MG 24 hr tablet TAKE 1 TABLET BY MOUTH EVERY DAY 01/30/22   Nelwyn Salisbury, MD  fluticasone [facility]) 50 MCG/ACT nasal spray Place 2 sprays into both nostrils daily. 07/26/21   Freddy Finner, NP  ipratropium (ATROVENT) 0.03 % nasal spray Place 2 sprays into both nostrils every 12 (twelve) hours. 10/05/21   Margaretann Loveless,  PA-C  lamoTRIgine (LAMICTAL) 100 MG tablet TAKE 1 TABLET BY MOUTH TWICE A DAY 01/28/22   Nelwyn Salisbury, MD  losartan-hydrochlorothiazide (HYZAAR) 100-25 MG tablet TAKE 1 TABLET BY MOUTH DAILY. **APPOINTMENT REQUIRED FOR FUTURE REFILLS** 01/28/22   Nelwyn Salisbury, MD  montelukast (SINGULAIR) 10 MG tablet TAKE 1 TABLET BY MOUTH EVERYDAY AT BEDTIME 01/29/22   Nelwyn Salisbury, MD  NEEDLE, DISP, 23 G (BD DISP NEEDLE) 23G X 1" MISC USE AS DIRECTED 04/21/20   Nelwyn Salisbury, MD  omeprazole (PRILOSEC) 40 MG capsule Take 1 capsule (40 mg total) by mouth daily. 01/04/22   Nelwyn Salisbury, MD  predniSONE (DELTASONE) 20 MG tablet Take 2  tablets (40 mg total) by mouth daily with breakfast. 10/05/21   Margaretann Loveless, PA-C  promethazine-dextromethorphan (PROMETHAZINE-DM) 6.25-15 MG/5ML syrup Take 5 mLs by mouth 4 (four) times daily as needed. 10/05/21   Margaretann Loveless, PA-C  rosuvastatin (CRESTOR) 10 MG tablet TAKE 1 TABLET BY MOUTH EVERY DAY 01/04/22   Nelwyn Salisbury, MD  tadalafil (CIALIS) 5 MG tablet AS NEEDED 01/04/22   Nelwyn Salisbury, MD  testosterone cypionate (DEPOTESTOSTERONE CYPIONATE) 200 MG/ML injection ADMINISTER 1 ML IN THE MUSCLE 1 TIME WEEKLY AS DIRECTED 01/04/22   Nelwyn Salisbury, MD    Family History Family History  Problem Relation Age of Onset   Lung cancer Father        was a smoker/work in coal mines   Heart disease Father    Asthma Paternal Grandmother    Heart disease Mother    Social History Social History   Tobacco Use   Smoking status: Former    Packs/day: 4.00    Years: 22.00    Total pack years: 88.00    Types: Cigarettes    Start date: 04/15/1974    Quit date: 04/15/1996    Years since quitting: 25.8   Smokeless tobacco: Never  Substance Use Topics   Alcohol use: No    Alcohol/week: 0.0 standard drinks of alcohol   Drug use: No   Allergies   Eggs or egg-derived products and Penicillins  Review of Systems Review of Systems Pertinent findings revealed after performing a 14 point review of systems has been noted in the history of present illness.  Physical Exam Triage Vital Signs ED Triage Vitals  Enc Vitals Group     BP 02/09/21 0827 (!) 147/82     Pulse Rate 02/09/21 0827 72     Resp 02/09/21 0827 18     Temp 02/09/21 0827 98.3 F (36.8 C)     Temp Source 02/09/21 0827 Oral     SpO2 02/09/21 0827 98 %     Weight --      Height --      Head Circumference --      Peak Flow --      Pain Score 02/09/21 0826 5     Pain Loc --      Pain Edu? --      Excl. in GC? --   No data found.  Updated Vital Signs BP (!) 149/81 (BP Location: Left Arm)   Pulse 62   Temp 98.5  F (36.9 C) (Oral)   Resp 16   SpO2 93%   Physical Exam Vitals and nursing note reviewed.  Constitutional:      General: He is not in acute distress.    Appearance: Normal appearance. He is normal weight. He is not ill-appearing.  HENT:  Head: Normocephalic and atraumatic.  Eyes:     Extraocular Movements: Extraocular movements intact.     Conjunctiva/sclera: Conjunctivae normal.     Pupils: Pupils are equal, round, and reactive to light.  Cardiovascular:     Rate and Rhythm: Normal rate and regular rhythm.  Pulmonary:     Effort: Pulmonary effort is normal.     Breath sounds: Normal breath sounds.  Musculoskeletal:        General: Normal range of motion.     Cervical back: Normal range of motion and neck supple.  Skin:    General: Skin is warm and dry.     Findings: Erythema (With warmth to touch, tenderness to palpation and induration of distal aspect of left upper leg) present.  Neurological:     General: No focal deficit present.     Mental Status: He is alert and oriented to person, place, and time. Mental status is at baseline.  Psychiatric:        Mood and Affect: Mood normal.        Behavior: Behavior normal.        Thought Content: Thought content normal.        Judgment: Judgment normal.     Visual Acuity Right Eye Distance:   Left Eye Distance:   Bilateral Distance:    Right Eye Near:   Left Eye Near:    Bilateral Near:     UC Couse / Diagnostics / Procedures:     Radiology No results found.  Procedures Procedures (including critical care time) EKG  Pending results:  Labs Reviewed - No data to display  Medications Ordered in UC: Medications - No data to display  UC Diagnoses / Final Clinical Impressions(s)   I have reviewed the triage vital signs and the nursing notes.  Pertinent labs & imaging results that were available during my care of the patient were reviewed by me and considered in my medical decision making (see chart for  details).    Final diagnoses:  Cellulitis of leg, left   Patient provided with a prescription of Bactrim to take for the next 5 days.  Patient advised to monitor the area of infection and go to the emergency room if no improvement in 24 hours or if signs of infection worsen over the next 5 days.  ED Prescriptions     Medication Sig Dispense Auth. Provider   sulfamethoxazole-trimethoprim (BACTRIM DS) 800-160 MG tablet Take 1 tablet by mouth 2 (two) times daily for 5 days. 10 tablet Lynden Oxford Scales, PA-C      PDMP not reviewed this encounter.  Pending results:  Labs Reviewed - No data to display  Discharge Instructions:   Discharge Instructions      To treat the infection in your leg around your injection site, please begin Bactrim 1 tablet twice daily for the next 5 days.  Please be sure that you always use a clean needle when injecting testosterone.  Please be sure that you also clean the area that you plan to inject with 2 separate alcohol swabs to make sure that any bacteria on your skin does not get injected into your skin when you insert the needle.  If you do not see meaningful resolution of the redness, swelling and pain in your left leg after completing antibiotics, please return for further evaluation.  If you notice worsening of the redness, swelling and pain in your left leg while taking antibiotics, please go to the emergency room from more  of treatment.  Thank you for visiting urgent care today.    Disposition Upon Discharge:  Condition: stable for discharge home  Patient presented with an acute illness with associated systemic symptoms and significant discomfort requiring urgent management. In my opinion, this is a condition that a prudent lay person (someone who possesses an average knowledge of health and medicine) may potentially expect to result in complications if not addressed urgently such as respiratory distress, impairment of bodily function or  dysfunction of bodily organs.   Routine symptom specific, illness specific and/or disease specific instructions were discussed with the patient and/or caregiver at length.   As such, the patient has been evaluated and assessed, work-up was performed and treatment was provided in alignment with urgent care protocols and evidence based medicine.  Patient/parent/caregiver has been advised that the patient may require follow up for further testing and treatment if the symptoms continue in spite of treatment, as clinically indicated and appropriate.  Patient/parent/caregiver has been advised to return to the [facility] or PCP if no better; to PCP or the Emergency Department if new signs and symptoms develop, or if the current signs or symptoms continue to change or worsen for further workup, evaluation and treatment as clinically indicated and appropriate  The patient will follow up with their current PCP if and as advised. If the patient does not currently have a PCP we will assist them in obtaining one.   The patient may need specialty follow up if the symptoms continue, in spite of conservative treatment and management, for further workup, evaluation, consultation and treatment as clinically indicated and appropriate.   Patient/parent/caregiver verbalized understanding and agreement of plan as discussed.  All questions were addressed during visit.  Please see discharge instructions below for further details of plan.  This office note has been dictated using Teaching laboratory technician.  Unfortunately, this method of dictation can sometimes lead to typographical or grammatical errors.  I apologize for your inconvenience in advance if this occurs.  Please do not hesitate to reach out to me if clarification is needed.      Theadora Rama Scales, PA-C 02/13/22 1657

## 2022-02-13 ENCOUNTER — Ambulatory Visit: Payer: Commercial Managed Care - PPO | Admitting: Family Medicine

## 2022-02-25 ENCOUNTER — Other Ambulatory Visit: Payer: Self-pay | Admitting: Family Medicine

## 2022-03-01 ENCOUNTER — Other Ambulatory Visit: Payer: Self-pay | Admitting: Family Medicine

## 2022-03-04 ENCOUNTER — Ambulatory Visit: Payer: Commercial Managed Care - PPO | Admitting: Family Medicine

## 2022-03-04 ENCOUNTER — Encounter: Payer: Self-pay | Admitting: Family Medicine

## 2022-03-04 VITALS — BP 140/92 | HR 75 | Temp 97.7°F | Wt 199.0 lb

## 2022-03-04 DIAGNOSIS — I10 Essential (primary) hypertension: Secondary | ICD-10-CM

## 2022-03-04 DIAGNOSIS — L578 Other skin changes due to chronic exposure to nonionizing radiation: Secondary | ICD-10-CM | POA: Diagnosis not present

## 2022-03-04 MED ORDER — LOSARTAN POTASSIUM 100 MG PO TABS: 100.0000 mg | ORAL_TABLET | Freq: Every day | ORAL | 3 refills | Status: DC

## 2022-03-04 MED ORDER — MOMETASONE FUROATE 0.1 % EX CREA: 1.0000 | TOPICAL_CREAM | Freq: Two times a day (BID) | CUTANEOUS | 5 refills | Status: DC

## 2022-03-04 MED ORDER — AMLODIPINE BESYLATE 5 MG PO TABS: 5.0000 mg | ORAL_TABLET | Freq: Every day | ORAL | 3 refills | Status: DC

## 2022-03-04 NOTE — Progress Notes (Signed)
   Subjective:    Patient ID: Andrew Swanson, male    DOB: [birth year], 61 y.o.   MRN: [id number]  HPI Here for several issues. First he has a lot of actinic damage on both forearms ,and the skin itches, he has seen Dermatology, and they had given him Mometasone cream. He asks if we could now prescribe this. Also the Dermatologist told him that his HCTZ could be contributing to the dryness of his skin, so has asks our advice.    Review of Systems  Constitutional: Negative.   Respiratory: Negative.    Cardiovascular: Negative.   Skin:  Positive for rash.       Objective:   Physical Exam Constitutional:      Appearance: Normal appearance.  Cardiovascular:     Rate and Rhythm: Normal rate and regular rhythm.     Pulses: Normal pulses.     Heart sounds: Normal heart sounds.  Pulmonary:     Effort: Pulmonary effort is normal.     Breath sounds: Normal breath sounds.  Skin:    Comments: The extensor surfaces of both forearms are red and scaly   Neurological:     Mental Status: He is alert.           Assessment & Plan:  For the dermatitis, we will give him Mometasone 0.01 % cream to apply BID. For the HTN, we will change Losartan HCT to Losartan 100 mg daily and remove the HCTZ. Also add Amlodipine 5 mg daily. He will report back in 4 weeks. Gershon Crane, MD

## 2022-03-07 ENCOUNTER — Telehealth: Payer: Commercial Managed Care - PPO | Admitting: Physician Assistant

## 2022-03-07 DIAGNOSIS — J441 Chronic obstructive pulmonary disease with (acute) exacerbation: Secondary | ICD-10-CM

## 2022-03-07 DIAGNOSIS — J069 Acute upper respiratory infection, unspecified: Secondary | ICD-10-CM

## 2022-03-07 MED ORDER — PREDNISONE 20 MG PO TABS: 40.0000 mg | ORAL_TABLET | Freq: Every day | ORAL | 0 refills | Status: DC

## 2022-03-07 MED ORDER — PROMETHAZINE-DM 6.25-15 MG/5ML PO SYRP: 5.0000 mL | ORAL_SOLUTION | Freq: Four times a day (QID) | ORAL | 0 refills | Status: DC | PRN

## 2022-03-07 NOTE — Progress Notes (Signed)
I have spent 5 minutes in review of e-visit questionnaire, review and updating patient chart, medical decision making and response to patient.   Dain Laseter Cody Hellon Vaccarella, PA-C    

## 2022-03-07 NOTE — Progress Notes (Signed)
We are sorry that you are not feeling well.  Here is how we plan to help!  Based on your presentation I believe you most likely have A cough due to a virus.  This is called viral bronchitis and is best treated by rest, plenty of fluids and control of the cough.  You may use Ibuprofen or Tylenol as directed to help your symptoms.  I am concerned that this viral illness is causing an exacerbation of your COPD. As such, I am adding on a prescription cough syrup and a short burst of prednisone to calm the exacerbation down so you are breathing more easily and feeling better.    If things are not quickly turning the corner, or you note any new or worsening symptoms, I want you to be evaluated in person. PLEASE DO NOT DELAY CARE.   From your responses in the eVisit questionnaire you describe inflammation in the upper respiratory tract which is causing a significant cough.  This is commonly called Bronchitis and has four common causes:   Allergies Viral Infections Acid Reflux Bacterial Infection Allergies, viruses and acid reflux are treated by controlling symptoms or eliminating the cause. An example might be a cough caused by taking certain blood pressure medications. You stop the cough by changing the medication. Another example might be a cough caused by acid reflux. Controlling the reflux helps control the cough.  USE OF BRONCHODILATOR ("RESCUE") INHALERS: There is a risk from using your bronchodilator too frequently.  The risk is that over-reliance on a medication which only relaxes the muscles surrounding the breathing tubes can reduce the effectiveness of medications prescribed to reduce swelling and congestion of the tubes themselves.  Although you feel brief relief from the bronchodilator inhaler, your asthma may actually be worsening with the tubes becoming more swollen and filled with mucus.  This can delay other crucial treatments, such as oral steroid medications. If you need to use a  bronchodilator inhaler daily, several times per day, you should discuss this with your provider.  There are probably better treatments that could be used to keep your asthma under control.     HOME CARE Only take medications as instructed by your medical team. Complete the entire course of an antibiotic. Drink plenty of fluids and get plenty of rest. Avoid close contacts especially the very young and the elderly Cover your mouth if you cough or cough into your sleeve. Always remember to wash your hands A steam or ultrasonic humidifier can help congestion.   GET HELP RIGHT AWAY IF: You develop worsening fever. You become short of breath You cough up blood. Your symptoms persist after you have completed your treatment plan MAKE SURE YOU  Understand these instructions. Will watch your condition. Will get help right away if you are not doing well or get worse.    Thank you for choosing an e-visit.  Your e-visit answers were reviewed by a board certified advanced clinical practitioner to complete your personal care plan. Depending upon the condition, your plan could have included both over the counter or prescription medications.  Please review your pharmacy choice. Make sure the pharmacy is open so you can pick up prescription now. If there is a problem, you may contact your provider through [employer] and have the prescription routed to another pharmacy.  Your safety is important to [place]. If you have drug allergies check your prescription carefully.   For the next 24 hours you can use MyChart to ask questions about  today's visit, request a non-urgent call back, or ask for a work or school excuse. You will get an email in the next two days asking about your experience. I hope that your e-visit has been valuable and will speed your recovery.

## 2022-03-13 ENCOUNTER — Telehealth: Payer: Commercial Managed Care - PPO | Admitting: Physician Assistant

## 2022-03-13 DIAGNOSIS — B9689 Other specified bacterial agents as the cause of diseases classified elsewhere: Secondary | ICD-10-CM

## 2022-03-14 MED ORDER — DOXYCYCLINE HYCLATE 100 MG PO TABS: 100.0000 mg | ORAL_TABLET | Freq: Two times a day (BID) | ORAL | 0 refills | Status: DC

## 2022-03-14 MED ORDER — BENZONATATE 100 MG PO CAPS: 100.0000 mg | ORAL_CAPSULE | Freq: Three times a day (TID) | ORAL | 0 refills | Status: DC | PRN

## 2022-03-14 NOTE — Progress Notes (Signed)
I have spent 5 minutes in review of e-visit questionnaire, review and updating patient chart, medical decision making and response to patient.   Zendayah Hardgrave Cody Jovane Foutz, PA-C    

## 2022-03-14 NOTE — Progress Notes (Signed)
We are sorry that you are not feeling well.  Here is how we plan to help!  Based on your presentation I believe you most likely have A cough due to bacteria.  When patients have a fever and a productive cough with a change in color or increased sputum production, we are concerned about bacterial bronchitis.  If left untreated it can progress to pneumonia. Giving you were seen in past week for respiratory symptoms that (based on current symptom duration) got better and then recurred, I am concerned for a true bacterial cause of symptoms. As such we are starting antibiotics.  If your symptoms do not improve with your treatment plan it is important that you contact your provider.   I have prescribed Doxycycline 100 mg twice a day for 7 days     In addition you may use A prescription cough medication called Tessalon Perles 100mg . You may take 1-2 capsules every 8 hours as needed for your cough.   From your responses in the eVisit questionnaire you describe inflammation in the upper respiratory tract which is causing a significant cough.  This is commonly called Bronchitis and has four common causes:   Allergies Viral Infections Acid Reflux Bacterial Infection Allergies, viruses and acid reflux are treated by controlling symptoms or eliminating the cause. An example might be a cough caused by taking certain blood pressure medications. You stop the cough by changing the medication. Another example might be a cough caused by acid reflux. Controlling the reflux helps control the cough.  USE OF BRONCHODILATOR ("RESCUE") INHALERS: There is a risk from using your bronchodilator too frequently.  The risk is that over-reliance on a medication which only relaxes the muscles surrounding the breathing tubes can reduce the effectiveness of medications prescribed to reduce swelling and congestion of the tubes themselves.  Although you feel brief relief from the bronchodilator inhaler, your asthma may actually be  worsening with the tubes becoming more swollen and filled with mucus.  This can delay other crucial treatments, such as oral steroid medications. If you need to use a bronchodilator inhaler daily, several times per day, you should discuss this with your provider.  There are probably better treatments that could be used to keep your asthma under control.     HOME CARE Only take medications as instructed by your medical team. Complete the entire course of an antibiotic. Drink plenty of fluids and get plenty of rest. Avoid close contacts especially the very young and the elderly Cover your mouth if you cough or cough into your sleeve. Always remember to wash your hands A steam or ultrasonic humidifier can help congestion.   GET HELP RIGHT AWAY IF: You develop worsening fever. You become short of breath You cough up blood. Your symptoms persist after you have completed your treatment plan MAKE SURE YOU  Understand these instructions. Will watch your condition. Will get help right away if you are not doing well or get worse.    Thank you for choosing an e-visit.  Your e-visit answers were reviewed by a board certified advanced clinical practitioner to complete your personal care plan. Depending upon the condition, your plan could have included both over the counter or prescription medications.  Please review your pharmacy choice. Make sure the pharmacy is open so you can pick up prescription now. If there is a problem, you may contact your provider through and have the prescription routed to another pharmacy.  Your safety is important to [employer]. If  you have drug allergies check your prescription carefully.   For the next 24 hours you can use MyChart to ask questions about today's visit, request a non-urgent call back, or ask for a work or school excuse. You will get an email in the next two days asking about your experience. I hope that your e-visit has been valuable and will  speed your recovery.

## 2022-03-30 ENCOUNTER — Other Ambulatory Visit: Payer: Self-pay | Admitting: Family Medicine

## 2022-04-08 ENCOUNTER — Telehealth: Payer: Commercial Managed Care - PPO | Admitting: Physician Assistant

## 2022-04-08 DIAGNOSIS — U071 COVID-19: Secondary | ICD-10-CM

## 2022-04-09 ENCOUNTER — Telehealth: Payer: Commercial Managed Care - PPO | Admitting: Physician Assistant

## 2022-04-09 ENCOUNTER — Telehealth: Payer: Commercial Managed Care - PPO

## 2022-04-09 DIAGNOSIS — U071 COVID-19: Secondary | ICD-10-CM | POA: Diagnosis not present

## 2022-04-09 MED ORDER — MOLNUPIRAVIR EUA 200MG CAPSULE: 4.0000 | ORAL_CAPSULE | Freq: Two times a day (BID) | ORAL | 0 refills | Status: AC

## 2022-04-09 MED ORDER — BENZONATATE 100 MG PO CAPS: 100.0000 mg | ORAL_CAPSULE | Freq: Three times a day (TID) | ORAL | 0 refills | Status: DC | PRN

## 2022-04-09 NOTE — Progress Notes (Signed)
   Thank you for the details you included in the comment boxes. Those details are very helpful in determining the best course of treatment for you and help [place] to provide the best care.Because you are COVID positive and giving your medication history, are a candidate for antivirals (which we cannot prescribe via evisit alone), we recommend that you convert this visit to a video visit in order for the provider to better assess what is going on.  The provider will be able to give you a more accurate diagnosis and treatment plan if we can more freely discuss your symptoms and with the addition of a virtual examination.   If you convert to a video visit, we will bill your insurance (similar to an office visit) and you will not be charged for this e-Visit. You will be able to stay at home and speak with the first available [facility] advanced practice provider. The link to do a video visit is in the drop down Menu tab of your Welcome screen in MyChart.

## 2022-04-09 NOTE — Patient Instructions (Signed)
Andrew Swanson, thank you for joining [name], PA-C for today's virtual visit.  While this provider is not your primary care provider (PCP), if your PCP is located in our provider database this encounter information will be shared with them immediately following your visit.   A [facility] MyChart account gives you access to today's visit and all your visits, tests, and labs performed at [facility] " click here if you don't have a [facility] MyChart account or go to [email or web address]  Consent: (Patient) Andrew Swanson provided verbal consent for this virtual visit at the beginning of the encounter.  Current Medications:  Current Outpatient Medications:    albuterol (VENTOLIN HFA) 108 (90 Base) MCG/ACT inhaler, INHALE 2 PUFFS BY MOUTH EVERY 4 HOURS AS NEEDED FOR WHEEZE OR SHORTNESS OF BREATH, Disp: 8.5 each, Rfl: 0   amLODipine (NORVASC) 5 MG tablet, Take 1 tablet (5 mg total) by mouth daily., Disp: 90 tablet, Rfl: 3   Azelastine HCl 137 MCG/SPRAY SOLN, USE 1 SPRAY IN EACH NOSTRIL EVERY DAY AS DIRECTED, Disp: 30 mL, Rfl: 1   B-D SYRINGE LUER-LOK 3CC 3 ML MISC, USE AS DIRECTED, Disp: 25 each, Rfl: 11   benzonatate (TESSALON) 100 MG capsule, Take 1 capsule (100 mg total) by mouth 3 (three) times daily as needed for cough., Disp: 30 capsule, Rfl: 0   budesonide-formoterol (SYMBICORT) 160-4.5 MCG/ACT inhaler, INHALE 2 PUFFS INTO THE LUNGS TWICE A DAY, Disp: 10.2 each, Rfl: 2   buPROPion (WELLBUTRIN XL) 300 MG 24 hr tablet, TAKE 1 TABLET BY MOUTH EVERY DAY, Disp: 90 tablet, Rfl: 0   doxycycline (VIBRA-TABS) 100 MG tablet, Take 1 tablet (100 mg total) by mouth 2 (two) times daily., Disp: 14 tablet, Rfl: 0   fluticasone (FLONASE) 50 MCG/ACT nasal spray, Place 2 sprays into both nostrils daily., Disp: 16 g, Rfl: 0   ipratropium (ATROVENT) 0.03 % nasal spray, Place 2 sprays into both nostrils every 12 (twelve) hours., Disp: 30 mL, Rfl: 0   lamoTRIgine (LAMICTAL) 100 MG  tablet, TAKE 1 TABLET BY MOUTH TWICE A DAY, Disp: 180 tablet, Rfl: 0   losartan (COZAAR) 100 MG tablet, Take 1 tablet (100 mg total) by mouth daily., Disp: 90 tablet, Rfl: 3   mometasone (ELOCON) 0.1 % cream, Apply 1 Application topically in the morning and at bedtime., Disp: 50 g, Rfl: 5   montelukast (SINGULAIR) 10 MG tablet, TAKE 1 TABLET BY MOUTH EVERYDAY AT BEDTIME, Disp: 30 tablet, Rfl: 2   NEEDLE, DISP, 22 G (BD DISP NEEDLES) 22G X 1-1/2" MISC, Inject 1 Needle into the muscle once a week., Disp: 50 each, Rfl: 0   NEEDLE, DISP, 23 G (BD DISP NEEDLE) 23G X 1" MISC, USE AS DIRECTED, Disp: 4 each, Rfl: 0   omeprazole (PRILOSEC) 40 MG capsule, TAKE 1 CAPSULE (40 MG TOTAL) BY MOUTH DAILY., Disp: 90 capsule, Rfl: 1   predniSONE (DELTASONE) 20 MG tablet, Take 2 tablets (40 mg total) by mouth daily with breakfast., Disp: 10 tablet, Rfl: 0   promethazine-dextromethorphan (PROMETHAZINE-DM) 6.25-15 MG/5ML syrup, Take 5 mLs by mouth 4 (four) times daily as needed for cough., Disp: 118 mL, Rfl: 0   rosuvastatin (CRESTOR) 10 MG tablet, TAKE 1 TABLET BY MOUTH EVERY DAY, Disp: 90 tablet, Rfl: 3   tadalafil (CIALIS) 5 MG tablet, AS NEEDED, Disp: 30 tablet, Rfl: 11   testosterone cypionate (DEPOTESTOSTERONE CYPIONATE) 200 MG/ML injection, ADMINISTER 1 ML IN THE MUSCLE 1 TIME WEEKLY AS DIRECTED, Disp: 4 mL,  Rfl: 5   Medications ordered in this encounter:  No orders of the defined types were placed in this encounter.    *If you need refills on other medications prior to your next appointment, please contact your pharmacy*  Follow-Up: Call back or seek an in-person evaluation if the symptoms worsen or if the condition fails to improve as anticipated.  [facility] [phone]  Other Instructions Please keep well-hydrated and get plenty of rest. Start a saline nasal rinse to flush out your nasal passages. You can use plain Mucinex to help thin congestion. If you have a humidifier, running  in the bedroom at night. I want you to start OTC vitamin D3 1000 units daily, vitamin C 1000 mg daily, and a zinc supplement. Please take prescribed medications as directed.  You have been enrolled in a MyChart symptom monitoring program. Please answer these questions daily so we can keep track of how you are doing.  You were to quarantine for 5 days from onset of your symptoms.  After day 5, if you have had no fever and you are feeling better, you can end quarantine but need to mask for an additional 5 days. After day 5 if you have a fever or are having significant symptoms, please quarantine for full 10 days.  If you note any worsening of symptoms, any significant shortness of breath or any chest pain, please seek ER evaluation ASAP.  Please do not delay care!  COVID-19: What to Do if You Are Sick If you test positive and are an older adult or someone who is at high risk of getting very sick from COVID-19, treatment may be available. Contact a healthcare provider right away after a positive test to determine if you are eligible, even if your symptoms are mild right now. You can also visit a Test to Treat location and, if eligible, receive a prescription from a provider. Don't delay: Treatment must be started within the first few days to be effective. If you have a fever, cough, or other symptoms, you might have COVID-19. Most people have mild illness and are able to recover at home. If you are sick: Keep track of your symptoms. If you have an emergency warning sign (including trouble breathing), call 911. Steps to help prevent the spread of COVID-19 if you are sick If you are sick with COVID-19 or think you might have COVID-19, follow the steps below to care for yourself and to help protect other people in your home and community. Stay home except to get medical care Stay home. Most people with COVID-19 have mild illness and can recover at home without medical care. Do not leave your home,  except to get medical care. Do not visit public areas and do not go to places where you are unable to wear a mask. Take care of yourself. Get rest and stay hydrated. Take over-the-counter medicines, such as acetaminophen, to help you feel better. Stay in touch with your doctor. Call before you get medical care. Be sure to get care if you have trouble breathing, or have any other emergency warning signs, or if you think it is an emergency. Avoid public transportation, ride-sharing, or taxis if possible. Get tested If you have symptoms of COVID-19, get tested. While waiting for test results, stay away from others, including staying apart from those living in your household. Get tested as soon as possible after your symptoms start. Treatments may be available for people with COVID-19 who are at  risk for becoming very sick. Don't delay: Treatment must be started early to be effective--some treatments must begin within 5 days of your first symptoms. Contact your healthcare provider right away if your test result is positive to determine if you are eligible. Self-tests are one of several options for testing for the virus that causes COVID-19 and may be more convenient than laboratory-based tests and point-of-care tests. Ask your healthcare provider or your local health department if you need help interpreting your test results. You can visit your state, tribal, local, and territorial health department's website to look for the latest local information on testing sites. Separate yourself from other people As much as possible, stay in a specific room and away from other people and pets in your home. If possible, you should use a separate bathroom. If you need to be around other people or animals in or outside of the home, wear a well-fitting mask. Tell your close contacts that they may have been exposed to COVID-19. An infected person can spread COVID-19 starting 48 hours (or 2 days) before the person has any  symptoms or tests positive. By letting your close contacts know they may have been exposed to COVID-19, you are helping to protect everyone. See COVID-19 and Animals if you have questions about pets. If you are diagnosed with COVID-19, someone from the health department may call you. Answer the call to slow the spread. Monitor your symptoms Symptoms of COVID-19 include fever, cough, or other symptoms. Follow care instructions from your healthcare provider and local health department. Your local health authorities may give instructions on checking your symptoms and reporting information. When to seek emergency medical attention Look for emergency warning signs* for COVID-19. If someone is showing any of these signs, seek emergency medical care immediately: Trouble breathing Persistent pain or pressure in the chest New confusion Inability to wake or stay awake Pale, gray, or blue-colored skin, lips, or nail beds, depending on skin tone *This list is not all possible symptoms. Please call your medical provider for any other symptoms that are severe or concerning to you. Call 911 or call ahead to your local emergency facility: Notify the operator that you are seeking care for someone who has or may have COVID-19. Call ahead before visiting your doctor Call ahead. Many medical visits for routine care are being postponed or done by phone or telemedicine. If you have a medical appointment that cannot be postponed, call your doctor's office, and tell them you have or may have COVID-19. This will help the office protect themselves and other patients. If you are sick, wear a well-fitting mask You should wear a mask if you must be around other people or animals, including pets (even at home). Wear a mask with the best fit, protection, and comfort for you. You don't need to wear the mask if you are alone. If you can't put on a mask (because of trouble breathing, for example), cover your coughs and sneezes  in some other way. Try to stay at least 6 feet away from other people. This will help protect the people around you. Masks should not be placed on young children under age 80 years, anyone who has trouble breathing, or anyone who is not able to remove the mask without help. Cover your coughs and sneezes Cover your mouth and nose with a tissue when you cough or sneeze. Throw away used tissues in a lined trash can. Immediately wash your hands with soap and water for at least  20 seconds. If soap and water are not available, clean your hands with an alcohol-based hand sanitizer that contains at least 60% alcohol. Clean your hands often Wash your hands often with soap and water for at least 20 seconds. This is especially important after blowing your nose, coughing, or sneezing; going to the bathroom; and before eating or preparing food. Use hand sanitizer if soap and water are not available. Use an alcohol-based hand sanitizer with at least 60% alcohol, covering all surfaces of your hands and rubbing them together until they feel dry. Soap and water are the best option, especially if hands are visibly dirty. Avoid touching your eyes, nose, and mouth with unwashed hands. Handwashing Tips Avoid sharing personal household items Do not share dishes, drinking glasses, cups, eating utensils, towels, or bedding with other people in your home. Wash these items thoroughly after using them with soap and water or put in the dishwasher. Clean surfaces in your home regularly Clean and disinfect high-touch surfaces (for example, doorknobs, tables, handles, light switches, and countertops) in your "sick room" and bathroom. In shared spaces, you should clean and disinfect surfaces and items after each use by the person who is ill. If you are sick and cannot clean, a caregiver or other person should only clean and disinfect the area around you (such as your bedroom and bathroom) on an as needed basis. Your caregiver/other  person should wait as long as possible (at least several hours) and wear a mask before entering, cleaning, and disinfecting shared spaces that you use. Clean and disinfect areas that may have blood, stool, or body fluids on them. Use household cleaners and disinfectants. Clean visible dirty surfaces with household cleaners containing soap or detergent. Then, use a household disinfectant. Use a product from [employer] List N: Disinfectants for Coronavirus (COVID-19). Be sure to follow the instructions on the label to ensure safe and effective use of the product. Many products recommend keeping the surface wet with a disinfectant for a certain period of time (look at "contact time" on the product label). You may also need to wear personal protective equipment, such as gloves, depending on the directions on the product label. Immediately after disinfecting, wash your hands with soap and water for 20 seconds. For completed guidance on cleaning and disinfecting your home, visit Complete Disinfection Guidance. Take steps to improve ventilation at home Improve ventilation (air flow) at home to help prevent from spreading COVID-19 to other people in your household. Clear out COVID-19 virus particles in the air by opening windows, using air filters, and turning on fans in your home. Use this interactive tool to learn how to improve air flow in your home. When you can be around others after being sick with COVID-19 Deciding when you can be around others is different for different situations. Find out when you can safely end home isolation. For any additional questions about your care, contact your healthcare provider or state or local health department. 07/04/2020 Content source: [facility] Immunization and Respiratory Diseases (NCIRD), Division of Viral Diseases This information is not intended to replace advice given to you by your health care provider. Make sure you discuss any questions you have with  your health care provider. Document Revised: 08/17/2020 Document Reviewed: 08/17/2020 Elsevier Patient Education  2022 [employer]      If you have been instructed to have an in-person evaluation today at a local Urgent Care facility, please use the link below. It will take you to a list  of all of our available [facility], including address, phone number and hours of operation. Please do not delay care.  [facility]  If you or a family member do not have a primary care provider, use the link below to schedule a visit and establish care. When you choose a [facility] primary care physician or advanced practice provider, you gain a long-term partner in health. Find a Primary Care Provider  Learn more about [facility] in-office and virtual care options: [facility]

## 2022-04-09 NOTE — Progress Notes (Signed)
Virtual Visit Consent   Andrew Swanson, you are scheduled for a virtual visit with a Andrew Swanson provider today. Just as with appointments in the office, your consent must be obtained to participate. Your consent will be active for this visit and any virtual visit you may have with one of our providers in the next 365 days. If you have a MyChart account, a copy of this consent can be sent to you electronically.  As this is a virtual visit, video technology does not allow for your provider to perform a traditional examination. This may limit your provider's ability to fully assess your condition. If your provider identifies any concerns that need to be evaluated in person or the need to arrange testing (such as labs, EKG, etc.), we will make arrangements to do so. Although advances in technology are sophisticated, we cannot ensure that it will always work on either your end or our end. If the connection with a video visit is poor, the visit may have to be switched to a telephone visit. With either a video or telephone visit, we are not always able to ensure that we have a secure connection.  By engaging in this virtual visit, you consent to the provision of healthcare and authorize for your insurance to be billed (if applicable) for the services provided during this visit. Depending on your insurance coverage, you may receive a charge related to this service.  I need to obtain your verbal consent now. Are you willing to proceed with your visit today? [name] has provided verbal consent on 04/09/2022 for a virtual visit (video or telephone). Andrew Swanson, [place]  Date: 04/09/2022 5:49 PM  Virtual Visit via Video Note   I, Andrew Swanson, connected with  CORDEL DREWES  ([id number], March 06, 1961) on 04/09/22 at  5:45 PM EST by a video-enabled telemedicine application and verified that I am speaking with the correct person using two identifiers.  Location: Patient: Virtual Visit Location  Patient: Home Provider: Virtual Visit Location Provider: Home Office   I discussed the limitations of evaluation and management by telemedicine and the availability of in person appointments. The patient expressed understanding and agreed to proceed.    History of Present Illness: Andrew Swanson is a 61 y.o. who identifies as a male who was assigned male at birth, and is being seen today for COVID-19. Symptoms starting 04/05/2022 with cough, head congestion, mild windedness (above baseline COPD), anorexia and aches. Tested positive for COVID on home test 12/25. Denies chest pain. Denies GI symptoms.   HPI: HPI  Problems:  Patient Active Problem List   Diagnosis Date Noted   Actinic skin damage 12/05/2020   Tachycardia 09/28/2014   Asthma exacerbation 09/25/2014   Hypogonadism male 11/24/2013   Other and unspecified hyperlipidemia 11/24/2013   Bipolar disorder (HCC) 07/27/2013   Obesity, unspecified 11/13/2012   COPD GOLD I with ? asthma component 02/06/2012   CAD 10/26/2009   GERD 10/26/2009   CHEST PAIN 09/27/2009   PNEUMONIA 07/11/2008   ALLERGIC RHINITIS 01/16/2007   Essential hypertension 12/11/2006   ASTHMA 12/11/2006    Allergies:  Allergies  Allergen Reactions   Eggs Or Egg-Derived Products     REACTION: vomitting   Penicillins     REACTION: unspecified   Medications:  Current Outpatient Medications:    benzonatate (TESSALON) 100 MG capsule, Take 1 capsule (100 mg total) by mouth 3 (three) times daily as needed for cough., Disp: 30 capsule, Rfl: 0  molnupiravir EUA (LAGEVRIO) 200 mg CAPS capsule, Take 4 capsules (800 mg total) by mouth 2 (two) times daily for 5 days., Disp: 40 capsule, Rfl: 0   albuterol (VENTOLIN HFA) 108 (90 Base) MCG/ACT inhaler, INHALE 2 PUFFS BY MOUTH EVERY 4 HOURS AS NEEDED FOR WHEEZE OR SHORTNESS OF BREATH, Disp: 8.5 each, Rfl: 0   amLODipine (NORVASC) 5 MG tablet, Take 1 tablet (5 mg total) by mouth daily., Disp: 90 tablet, Rfl: 3    Azelastine HCl 137 MCG/SPRAY SOLN, USE 1 SPRAY IN EACH NOSTRIL EVERY DAY AS DIRECTED, Disp: 30 mL, Rfl: 1   B-D SYRINGE LUER-LOK 3CC 3 ML MISC, USE AS DIRECTED, Disp: 25 each, Rfl: 11   budesonide-formoterol (SYMBICORT) 160-4.5 MCG/ACT inhaler, INHALE 2 PUFFS INTO THE LUNGS TWICE A DAY, Disp: 10.2 each, Rfl: 2   buPROPion (WELLBUTRIN XL) 300 MG 24 hr tablet, TAKE 1 TABLET BY MOUTH EVERY DAY, Disp: 90 tablet, Rfl: 0   ipratropium (ATROVENT) 0.03 % nasal spray, Place 2 sprays into both nostrils every 12 (twelve) hours., Disp: 30 mL, Rfl: 0   lamoTRIgine (LAMICTAL) 100 MG tablet, TAKE 1 TABLET BY MOUTH TWICE A DAY, Disp: 180 tablet, Rfl: 0   losartan (COZAAR) 100 MG tablet, Take 1 tablet (100 mg total) by mouth daily., Disp: 90 tablet, Rfl: 3   mometasone (ELOCON) 0.1 % cream, Apply 1 Application topically in the morning and at bedtime., Disp: 50 g, Rfl: 5   montelukast (SINGULAIR) 10 MG tablet, TAKE 1 TABLET BY MOUTH EVERYDAY AT BEDTIME, Disp: 30 tablet, Rfl: 2   NEEDLE, DISP, 22 G (BD DISP NEEDLES) 22G X 1-1/2" MISC, Inject 1 Needle into the muscle once a week., Disp: 50 each, Rfl: 0   NEEDLE, DISP, 23 G (BD DISP NEEDLE) 23G X 1" MISC, USE AS DIRECTED, Disp: 4 each, Rfl: 0   omeprazole (PRILOSEC) 40 MG capsule, TAKE 1 CAPSULE (40 MG TOTAL) BY MOUTH DAILY., Disp: 90 capsule, Rfl: 1   rosuvastatin (CRESTOR) 10 MG tablet, TAKE 1 TABLET BY MOUTH EVERY DAY, Disp: 90 tablet, Rfl: 3   tadalafil (CIALIS) 5 MG tablet, AS NEEDED, Disp: 30 tablet, Rfl: 11   testosterone cypionate (DEPOTESTOSTERONE CYPIONATE) 200 MG/ML injection, ADMINISTER 1 ML IN THE MUSCLE 1 TIME WEEKLY AS DIRECTED, Disp: 4 mL, Rfl: 5  Observations/Objective: Patient is well-developed, well-nourished in no acute distress.  Resting comfortably at home.  Head is normocephalic, atraumatic.  No labored breathing. Speech is clear and coherent with logical content.  Patient is alert and oriented at baseline.   Assessment and Plan: 1.  COVID-19 - benzonatate (TESSALON) 100 MG capsule; Take 1 capsule (100 mg total) by mouth 3 (three) times daily as needed for cough.  Dispense: 30 capsule; Refill: 0 - molnupiravir EUA (LAGEVRIO) 200 mg CAPS capsule; Take 4 capsules (800 mg total) by mouth 2 (two) times daily for 5 days.  Dispense: 40 capsule; Refill: 0  Patient with multiple risk factors for complicated course of illness. Discussed risks/benefits of antiviral medications including most common potential ADRs. Patient voiced understanding and would like to proceed with antiviral medication. They are candidate for Molnupiravir. Rx sent to pharmacy. Supportive measures, OTC medications and vitamin regimen reviewed. Tessalon per orders. Patient has been enrolled in a MyChart COVID symptom monitoring program. Anne Shutter reviewed in detail. Strict ER precautions discussed with patient.    Follow Up Instructions: I discussed the assessment and treatment plan with the patient. The patient was provided an opportunity to ask questions and all  were answered. The patient agreed with the plan and demonstrated an understanding of the instructions.  A copy of instructions were sent to the patient via MyChart unless otherwise noted below.   The patient was advised to call back or seek an in-person evaluation if the symptoms worsen or if the condition fails to improve as anticipated.  Time:  I spent 10 minutes with the patient via telehealth technology discussing the above problems/concerns.    Leeanne Rio, PA-C

## 2022-04-11 ENCOUNTER — Other Ambulatory Visit: Payer: Self-pay | Admitting: Family Medicine

## 2022-05-04 ENCOUNTER — Other Ambulatory Visit: Payer: Self-pay | Admitting: Family Medicine

## 2022-05-08 ENCOUNTER — Other Ambulatory Visit: Payer: Self-pay | Admitting: Family Medicine

## 2022-05-27 ENCOUNTER — Other Ambulatory Visit: Payer: Self-pay | Admitting: Family Medicine

## 2022-05-27 ENCOUNTER — Encounter: Payer: Self-pay | Admitting: Family Medicine

## 2022-05-27 ENCOUNTER — Ambulatory Visit: Payer: 59 | Admitting: Family Medicine

## 2022-05-27 VITALS — BP 140/80 | HR 76 | Temp 98.1°F | Wt 194.0 lb

## 2022-05-27 DIAGNOSIS — J4540 Moderate persistent asthma, uncomplicated: Secondary | ICD-10-CM

## 2022-05-27 MED ORDER — METHYLPREDNISOLONE 4 MG PO TBPK: ORAL_TABLET | ORAL | 0 refills | Status: DC

## 2022-05-27 MED ORDER — FLUTICASONE FUROATE-VILANTEROL 200-25 MCG/ACT IN AEPB: 1.0000 | INHALATION_SPRAY | Freq: Every day | RESPIRATORY_TRACT | 11 refills | Status: DC

## 2022-05-27 MED ORDER — ALBUTEROL SULFATE (2.5 MG/3ML) 0.083% IN NEBU: 2.5000 mg | INHALATION_SOLUTION | RESPIRATORY_TRACT | 11 refills | Status: DC | PRN

## 2022-05-27 NOTE — Telephone Encounter (Signed)
  Pharmacy comment: Alternative Requested:INSURANCE WANTS SYMBICORT.

## 2022-05-27 NOTE — Telephone Encounter (Signed)
Cancel the Breo and call in Symbicort 160-4.5 2 puffs BID, 90 day supply with 3 rf

## 2022-05-27 NOTE — Progress Notes (Signed)
   Subjective:    Patient ID: Marvis Moeller, male    DOB: 1961-01-10, 62 y.o.   MRN: [id number]  HPI Here for several asthma issues. First his insurance recently changed, and the new plan no longer covers Symbicort. Also he needs refills for albuterol vials for his nebulizer. He has been using this more often for the past past few weeks because he has been wheezing or feeling SOB. He does not feels sick, no fever. Some dry coughing.    Review of Systems  Constitutional: Negative.   HENT: Negative.    Respiratory:  Positive for cough, shortness of breath and wheezing.   Cardiovascular: Negative.        Objective:   Physical Exam Constitutional:      Appearance: Normal appearance. He is not ill-appearing.  Cardiovascular:     Rate and Rhythm: Normal rate and regular rhythm.     Pulses: Normal pulses.     Heart sounds: Normal heart sounds.  Pulmonary:     Effort: Pulmonary effort is normal.     Breath sounds: Wheezing present. No rhonchi or rales.  Neurological:     Mental Status: He is alert.           Assessment & Plan:  His asthma has been more active lately. No signs of a current infections. We refilled the albuterol vials, and we will give him a Medrol dose pack. He will try Breo instead of Symbicort.  Alysia Penna, MD

## 2022-05-29 ENCOUNTER — Other Ambulatory Visit: Payer: Self-pay | Admitting: Family Medicine

## 2022-06-02 ENCOUNTER — Other Ambulatory Visit: Payer: Self-pay | Admitting: Family Medicine

## 2022-06-09 ENCOUNTER — Other Ambulatory Visit: Payer: Self-pay | Admitting: Family Medicine

## 2022-06-09 ENCOUNTER — Encounter: Payer: Self-pay | Admitting: Family Medicine

## 2022-06-11 MED ORDER — BUDESONIDE-FORMOTEROL FUMARATE 160-4.5 MCG/ACT IN AERO: 2.0000 | INHALATION_SPRAY | Freq: Two times a day (BID) | RESPIRATORY_TRACT | 12 refills | Status: DC

## 2022-06-11 NOTE — Telephone Encounter (Signed)
Done

## 2022-06-18 ENCOUNTER — Encounter: Payer: Self-pay | Admitting: Family Medicine

## 2022-06-19 MED ORDER — FLUTICASONE-SALMETEROL 250-50 MCG/ACT IN AEPB: 1.0000 | INHALATION_SPRAY | Freq: Two times a day (BID) | RESPIRATORY_TRACT | 11 refills | Status: DC

## 2022-06-19 NOTE — Telephone Encounter (Signed)
That's fine. I sent in a RX for Wixela to his pharmacy

## 2022-07-01 ENCOUNTER — Other Ambulatory Visit: Payer: Self-pay | Admitting: Family Medicine

## 2022-07-26 ENCOUNTER — Other Ambulatory Visit: Payer: Self-pay | Admitting: Family Medicine

## 2022-07-31 ENCOUNTER — Other Ambulatory Visit: Payer: Self-pay | Admitting: Family Medicine

## 2022-09-11 ENCOUNTER — Telehealth: Payer: 59 | Admitting: Nurse Practitioner

## 2022-09-11 DIAGNOSIS — J0140 Acute pansinusitis, unspecified: Secondary | ICD-10-CM

## 2022-09-11 MED ORDER — DOXYCYCLINE HYCLATE 100 MG PO TABS: 100.0000 mg | ORAL_TABLET | Freq: Two times a day (BID) | ORAL | 0 refills | Status: AC

## 2022-09-11 MED ORDER — FLUTICASONE PROPIONATE 50 MCG/ACT NA SUSP: 2.0000 | Freq: Every day | NASAL | 6 refills | Status: AC

## 2022-09-11 NOTE — Progress Notes (Signed)
E-Visit for Sinus Problems  We are sorry that you are not feeling well.  Here is how we plan to help!  Based on what you have shared with me it looks like you have sinusitis.  Sinusitis is inflammation and infection in the sinus cavities of the head.  Based on your presentation I believe you most likely have Acute Bacterial Sinusitis.  This is an infection caused by bacteria and is treated with antibiotics. I have prescribed Doxycycline 100mg  by mouth twice a day for 10 days. We will also prescribe a nasal spray for added support   Meds ordered this encounter  Medications   doxycycline (VIBRA-TABS) 100 MG tablet    Sig: Take 1 tablet (100 mg total) by mouth 2 (two) times daily for 10 days.    Dispense:  20 tablet    Refill:  0   fluticasone (FLONASE) 50 MCG/ACT nasal spray    Sig: Place 2 sprays into both nostrils daily.    Dispense:  16 g    Refill:  6   You may use an oral decongestant such as Mucinex D or if you have glaucoma or high blood pressure use plain Mucinex. Saline nasal spray help and can safely be used as often as needed for congestion.  If you develop worsening sinus pain, fever or notice severe headache and vision changes, or if symptoms are not better after completion of antibiotic, please schedule an appointment with a health care provider.    Sinus infections are not as easily transmitted as other respiratory infection, however we still recommend that you avoid close contact with loved ones, especially the very young and elderly.  Remember to wash your hands thoroughly throughout the day as this is the number one way to prevent the spread of infection!  Home Care: Only take medications as instructed by your medical team. Complete the entire course of an antibiotic. Do not take these medications with alcohol. A steam or ultrasonic humidifier can help congestion.  You can place a towel over your head and breathe in the steam from hot water coming from a faucet. Avoid close  contacts especially the very young and the elderly. Cover your mouth when you cough or sneeze. Always remember to wash your hands.  Get Help Right Away If: You develop worsening fever or sinus pain. You develop a severe head ache or visual changes. Your symptoms persist after you have completed your treatment plan.  Make sure you Understand these instructions. Will watch your condition. Will get help right away if you are not doing well or get worse.  Thank you for choosing an e-visit.  Your e-visit answers were reviewed by a board certified advanced clinical practitioner to complete your personal care plan. Depending upon the condition, your plan could have included both over the counter or prescription medications.  Please review your pharmacy choice. Make sure the pharmacy is open so you can pick up prescription now. If there is a problem, you may contact your provider through [employer] and have the prescription routed to another pharmacy.  Your safety is important to [place]. If you have drug allergies check your prescription carefully.   For the next 24 hours you can use MyChart to ask questions about today's visit, request a non-urgent call back, or ask for a work or school excuse. You will get an email in the next two days asking about your experience. I hope that your e-visit has been valuable and will speed your recovery.  I spent approximately 5 minutes reviewing the patient's history, current symptoms and coordinating their care today.

## 2022-09-16 ENCOUNTER — Other Ambulatory Visit: Payer: Self-pay

## 2022-09-16 ENCOUNTER — Encounter: Payer: Self-pay | Admitting: Family Medicine

## 2022-09-16 DIAGNOSIS — E291 Testicular hypofunction: Secondary | ICD-10-CM

## 2022-09-16 NOTE — Telephone Encounter (Signed)
Pt LOV was on 05/27/2022 Last refill was done on 01/04/22 Please advise

## 2022-09-17 NOTE — Telephone Encounter (Signed)
Done

## 2022-09-21 ENCOUNTER — Other Ambulatory Visit: Payer: Self-pay | Admitting: Family Medicine

## 2022-09-23 NOTE — Telephone Encounter (Signed)
Pt LOV was on 05/27/22 Last refill was done on 01/04/22 Last Testosterone lab was done on 12/05/20 Please advise

## 2022-09-27 MED ORDER — TESTOSTERONE CYPIONATE 200 MG/ML IM SOLN: 200.0000 mg | INTRAMUSCULAR | 5 refills | Status: DC

## 2022-09-27 NOTE — Telephone Encounter (Signed)
Done

## 2022-09-27 NOTE — Addendum Note (Signed)
Addended by: Gershon Crane A on: 09/27/2022 12:51 PM   Modules accepted: Orders

## 2022-10-18 ENCOUNTER — Other Ambulatory Visit: Payer: Self-pay | Admitting: Family Medicine

## 2022-10-19 ENCOUNTER — Other Ambulatory Visit: Payer: Self-pay | Admitting: Family Medicine

## 2022-10-22 ENCOUNTER — Other Ambulatory Visit: Payer: Self-pay | Admitting: Family Medicine

## 2022-11-07 ENCOUNTER — Other Ambulatory Visit: Payer: Self-pay | Admitting: Family Medicine

## 2022-12-09 ENCOUNTER — Other Ambulatory Visit: Payer: Self-pay | Admitting: Family Medicine

## 2023-01-17 ENCOUNTER — Other Ambulatory Visit: Payer: Self-pay | Admitting: Family Medicine

## 2023-01-17 DIAGNOSIS — Z1211 Encounter for screening for malignant neoplasm of colon: Secondary | ICD-10-CM

## 2023-01-17 DIAGNOSIS — Z1212 Encounter for screening for malignant neoplasm of rectum: Secondary | ICD-10-CM

## 2023-01-30 ENCOUNTER — Other Ambulatory Visit: Payer: Self-pay | Admitting: Family Medicine

## 2023-02-13 ENCOUNTER — Other Ambulatory Visit: Payer: Self-pay | Admitting: Family Medicine

## 2023-02-22 LAB — COLOGUARD

## 2023-03-16 LAB — COLOGUARD: COLOGUARD: NEGATIVE

## 2023-03-28 ENCOUNTER — Other Ambulatory Visit: Payer: Self-pay | Admitting: Family Medicine

## 2023-04-05 ENCOUNTER — Telehealth: Payer: 59

## 2023-04-05 DIAGNOSIS — J069 Acute upper respiratory infection, unspecified: Secondary | ICD-10-CM

## 2023-04-05 DIAGNOSIS — J0100 Acute maxillary sinusitis, unspecified: Secondary | ICD-10-CM

## 2023-04-06 MED ORDER — DOXYCYCLINE HYCLATE 100 MG PO TABS: 100.0000 mg | ORAL_TABLET | Freq: Two times a day (BID) | ORAL | 0 refills | Status: AC

## 2023-04-06 NOTE — Progress Notes (Signed)
E-Visit for Sinus Problems  We are sorry that you are not feeling well.  Here is how we plan to help!  Based on what you have shared with me it looks like you have sinusitis.  Sinusitis is inflammation and infection in the sinus cavities of the head.  Based on your presentation I believe you most likely have Acute Bacterial Sinusitis.  This is an infection caused by bacteria and is treated with antibiotics. I have prescribed Doxycycline 100mg by mouth twice a day for 10 days. You may use an oral decongestant such as Mucinex D or if you have glaucoma or high blood pressure use plain Mucinex. Saline nasal spray help and can safely be used as often as needed for congestion.  If you develop worsening sinus pain, fever or notice severe headache and vision changes, or if symptoms are not better after completion of antibiotic, please schedule an appointment with a health care provider.    Sinus infections are not as easily transmitted as other respiratory infection, however we still recommend that you avoid close contact with loved ones, especially the very young and elderly.  Remember to wash your hands thoroughly throughout the day as this is the number one way to prevent the spread of infection!  Home Care: Only take medications as instructed by your medical team. Complete the entire course of an antibiotic. Do not take these medications with alcohol. A steam or ultrasonic humidifier can help congestion.  You can place a towel over your head and breathe in the steam from hot water coming from a faucet. Avoid close contacts especially the very young and the elderly. Cover your mouth when you cough or sneeze. Always remember to wash your hands.  Get Help Right Away If: You develop worsening fever or sinus pain. You develop a severe head ache or visual changes. Your symptoms persist after you have completed your treatment plan.  Make sure you Understand these instructions. Will watch your  condition. Will get help right away if you are not doing well or get worse.  Thank you for choosing an e-visit.  Your e-visit answers were reviewed by a board certified advanced clinical practitioner to complete your personal care plan. Depending upon the condition, your plan could have included both over the counter or prescription medications.  Please review your pharmacy choice. Make sure the pharmacy is open so you can pick up prescription now. If there is a problem, you may contact your provider through MyChart messaging and have the prescription routed to another pharmacy.  Your safety is important to us. If you have drug allergies check your prescription carefully.   For the next 24 hours you can use MyChart to ask questions about today's visit, request a non-urgent call back, or ask for a work or school excuse. You will get an email in the next two days asking about your experience. I hope that your e-visit has been valuable and will speed your recovery.    have provided 5 minutes of non face to face time during this encounter for chart review and documentation.   

## 2023-04-17 ENCOUNTER — Other Ambulatory Visit: Payer: Self-pay | Admitting: Family Medicine

## 2023-05-08 ENCOUNTER — Other Ambulatory Visit: Payer: Self-pay | Admitting: Nurse Practitioner

## 2023-05-08 ENCOUNTER — Other Ambulatory Visit: Payer: Self-pay | Admitting: Family Medicine

## 2023-05-08 DIAGNOSIS — J0140 Acute pansinusitis, unspecified: Secondary | ICD-10-CM

## 2023-06-09 ENCOUNTER — Other Ambulatory Visit: Payer: Self-pay | Admitting: Family Medicine

## 2023-06-16 ENCOUNTER — Other Ambulatory Visit: Payer: Self-pay | Admitting: Family Medicine

## 2023-06-16 ENCOUNTER — Telehealth: Admitting: Family Medicine

## 2023-06-16 DIAGNOSIS — J069 Acute upper respiratory infection, unspecified: Secondary | ICD-10-CM | POA: Diagnosis not present

## 2023-06-16 MED ORDER — IPRATROPIUM BROMIDE 0.03 % NA SOLN
2.0000 | Freq: Two times a day (BID) | NASAL | 0 refills | Status: AC
Start: 2023-06-16 — End: ?

## 2023-06-16 MED ORDER — BUPROPION HCL ER (XL) 300 MG PO TB24: 300.0000 mg | ORAL_TABLET | Freq: Every day | ORAL | 0 refills | Status: DC

## 2023-06-16 MED ORDER — BENZONATATE 100 MG PO CAPS: 100.0000 mg | ORAL_CAPSULE | Freq: Three times a day (TID) | ORAL | 0 refills | Status: DC | PRN

## 2023-06-16 NOTE — Progress Notes (Signed)

## 2023-06-27 ENCOUNTER — Encounter

## 2023-06-27 ENCOUNTER — Other Ambulatory Visit: Payer: Self-pay | Admitting: Family Medicine

## 2023-06-27 ENCOUNTER — Telehealth: Admitting: Family Medicine

## 2023-06-27 DIAGNOSIS — J301 Allergic rhinitis due to pollen: Secondary | ICD-10-CM

## 2023-06-27 MED ORDER — FLUTICASONE PROPIONATE 50 MCG/ACT NA SUSP: 2.0000 | Freq: Every day | NASAL | 6 refills | Status: DC

## 2023-06-27 MED ORDER — PREDNISONE 20 MG PO TABS
20.0000 mg | ORAL_TABLET | Freq: Two times a day (BID) | ORAL | 0 refills | Status: AC
Start: 2023-06-27 — End: 2023-07-02

## 2023-06-27 NOTE — Progress Notes (Signed)
 E visit for Allergic Rhinitis We are sorry that you are not feeling well.  Here is how we plan to help!  Based on what you have shared with me it looks like you have Allergic Rhinitis.  Rhinitis is when a reaction occurs that causes nasal congestion, runny nose, sneezing, and itching.  Most types of rhinitis are caused by an inflammation and are associated with symptoms in the eyes ears or throat. There are several types of rhinitis.  The most common are acute rhinitis, which is usually caused by a viral illness, allergic or seasonal rhinitis, and nonallergic or year-round rhinitis.  Nasal allergies occur certain times of the year.  Allergic rhinitis is caused when allergens in the air trigger the release of histamine in the body.  Histamine causes itching, swelling, and fluid to build up in the fragile linings of the nasal passages, sinuses and eyelids.  An itchy nose and clear discharge are common.  I recommend the following over the counter treatments: You should take a daily dose of antihistamine  I also would recommend a nasal spray: Flonase 2 sprays into each nostril once daily  I am also sending prednisone  HOME CARE:  You can use an over-the-counter saline nasal spray as needed Avoid areas where there is heavy dust, mites, or molds Stay indoors on windy days during the pollen season Keep windows closed in home, at least in bedroom; use air conditioner. Use high-efficiency house air filter Keep windows closed in car, turn AC on re-circulate Avoid playing out with dog during pollen season  GET HELP RIGHT AWAY IF:  If your symptoms do not improve within 10 days You become short of breath You develop yellow or green discharge from your nose for over 3 days You have coughing fits  MAKE SURE YOU:  Understand these instructions Will watch your condition Will get help right away if you are not doing well or get worse  Thank you for choosing an e-visit. Your e-visit answers were  reviewed by a board certified advanced clinical practitioner to complete your personal care plan. Depending upon the condition, your plan could have included both over the counter or prescription medications. Please review your pharmacy choice. Be sure that the pharmacy you have chosen is open so that you can pick up your prescription now.  If there is a problem you may message your provider in MyChart to have the prescription routed to another pharmacy. Your safety is important to [place]. If you have drug allergies check your prescription carefully.  For the next 24 hours, you can use MyChart to ask questions about today's visit, request a non-urgent call back, or ask for a work or school excuse from your e-visit provider. You will get an email in the next two days asking about your experience. I hope that your e-visit has been valuable and will speed your recovery.   have provided 5 minutes of non face to face time during this encounter for chart review and documentation.

## 2023-06-29 ENCOUNTER — Other Ambulatory Visit: Payer: Self-pay | Admitting: Family Medicine

## 2023-07-15 ENCOUNTER — Other Ambulatory Visit: Payer: Self-pay | Admitting: Family Medicine

## 2023-07-31 ENCOUNTER — Other Ambulatory Visit: Payer: Self-pay | Admitting: Family Medicine

## 2023-08-11 ENCOUNTER — Other Ambulatory Visit: Payer: Self-pay | Admitting: Family Medicine

## 2023-09-02 IMAGING — US US EXTREM LOW VENOUS*L*
1 series · 13 of 24 positions shown · non-contrast
Comparison: None.

CLINICAL DATA: Initial evaluation for acute left calf pain.



[Series 1: us venous img lower uni left (dvt) · portal-venous · 13 of 35 slices shown]
[im 1/35]
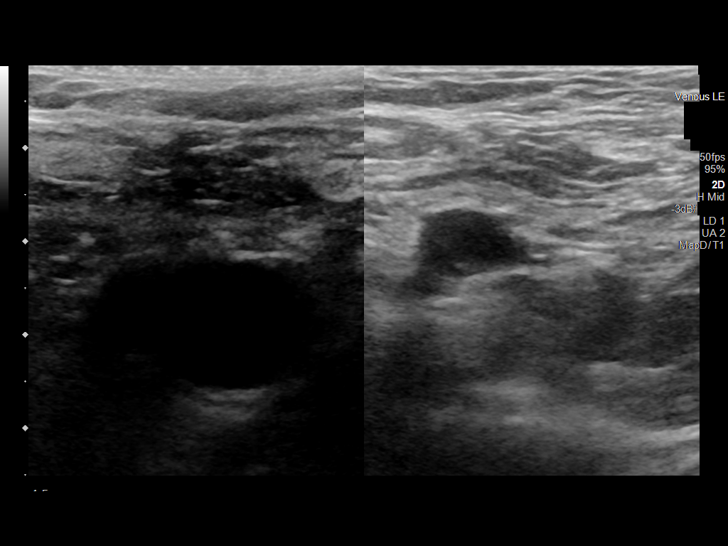
[im 3/35]
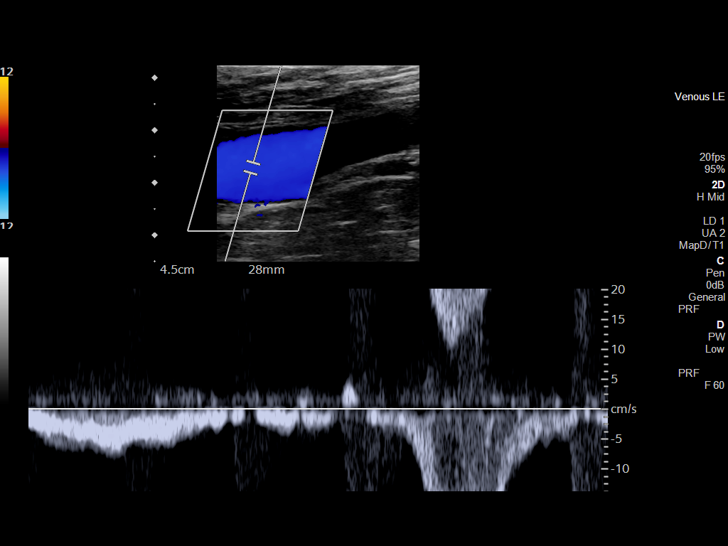
[im 6/35]
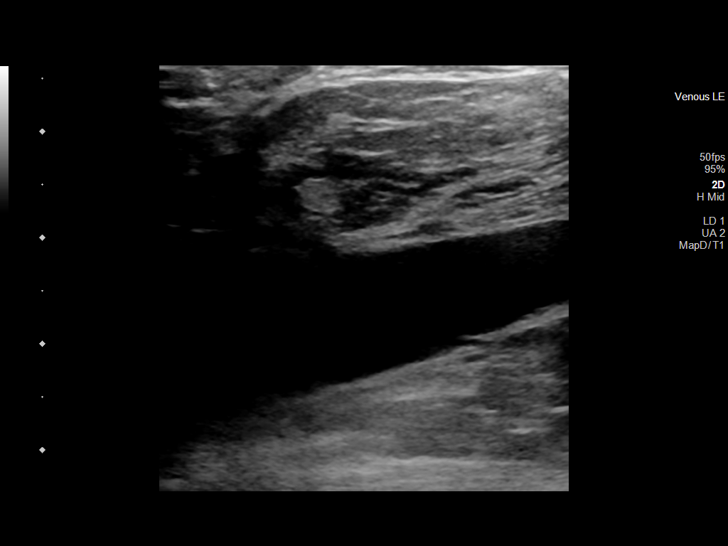
[im 9/35]
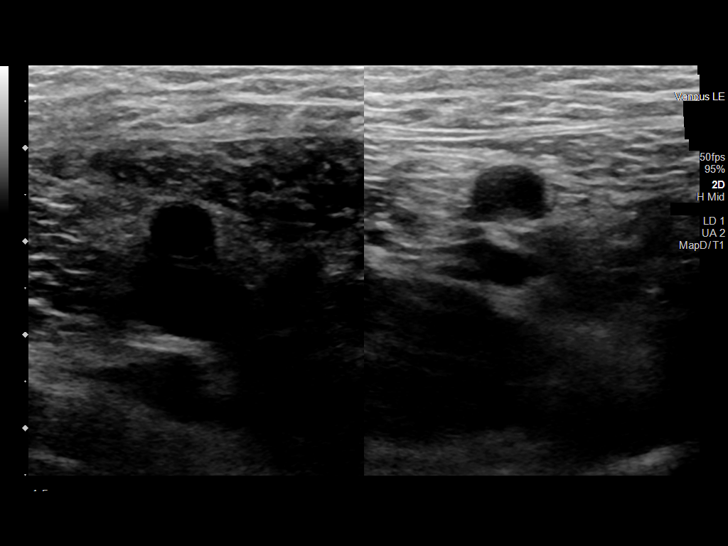
[im 12/35]
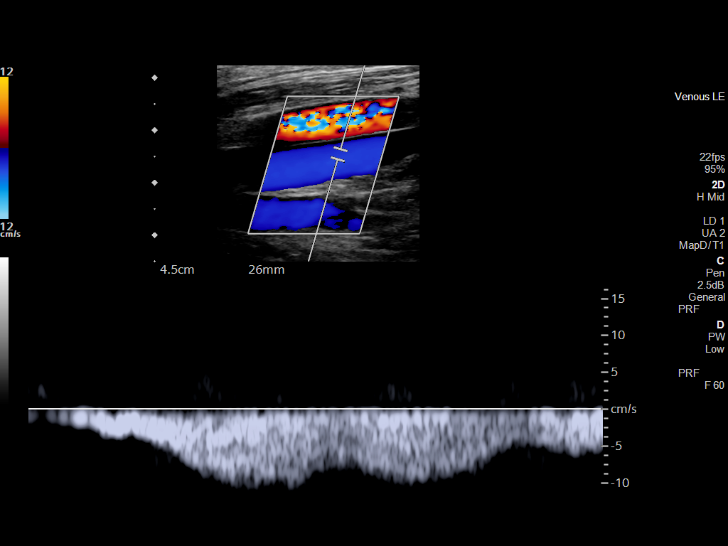
[im 15/35]
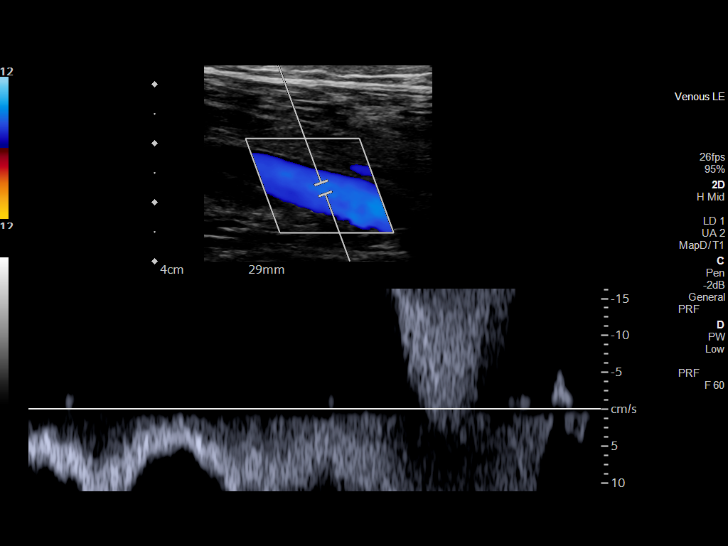
[im 18/35]
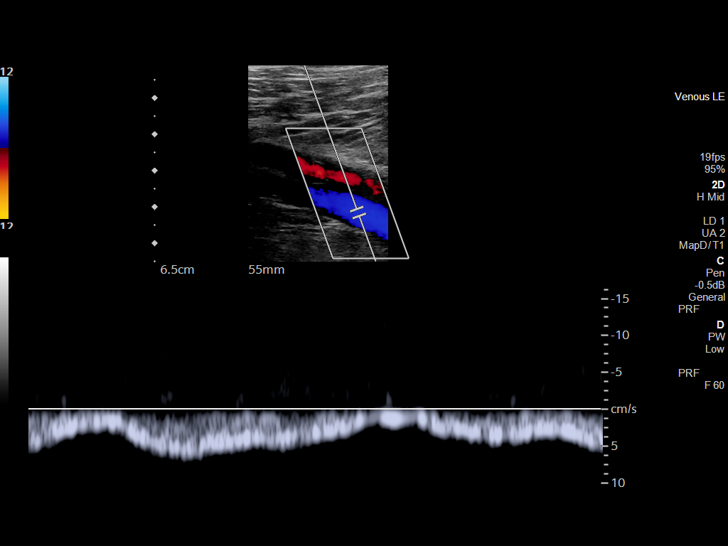
[im 20/35]
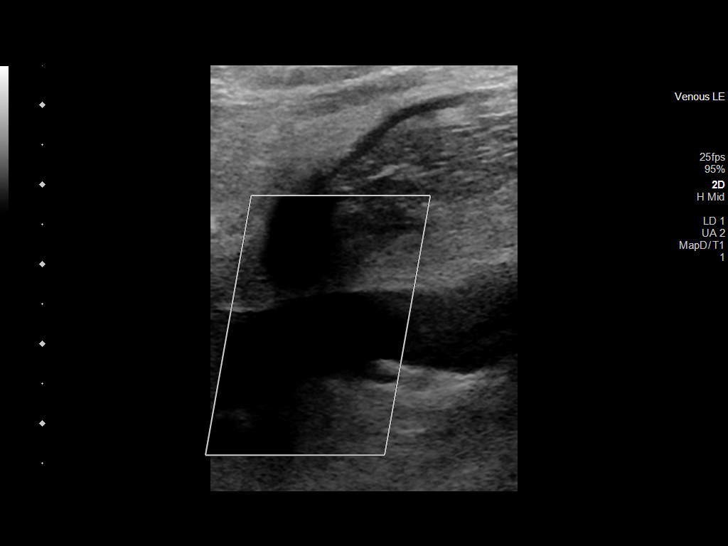
[im 23/35]
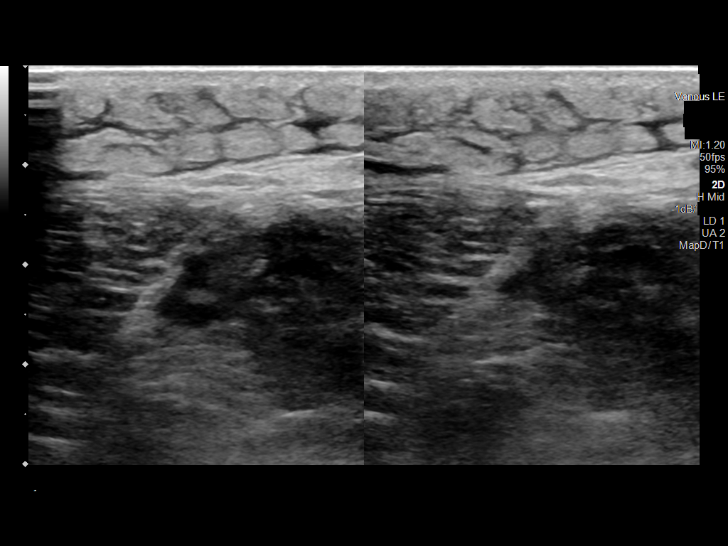
[im 26/35]
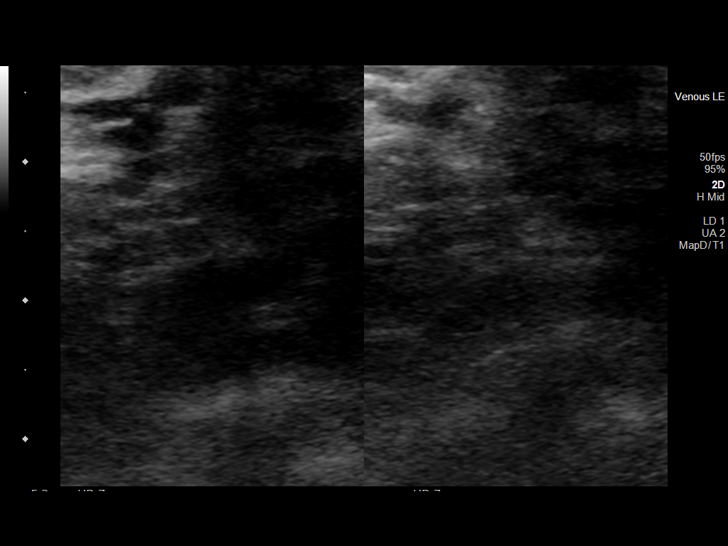
[im 29/35]
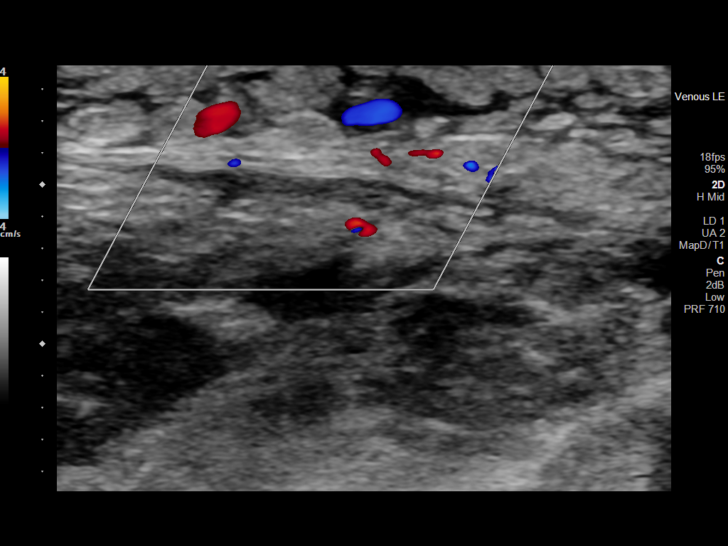
[im 32/35]
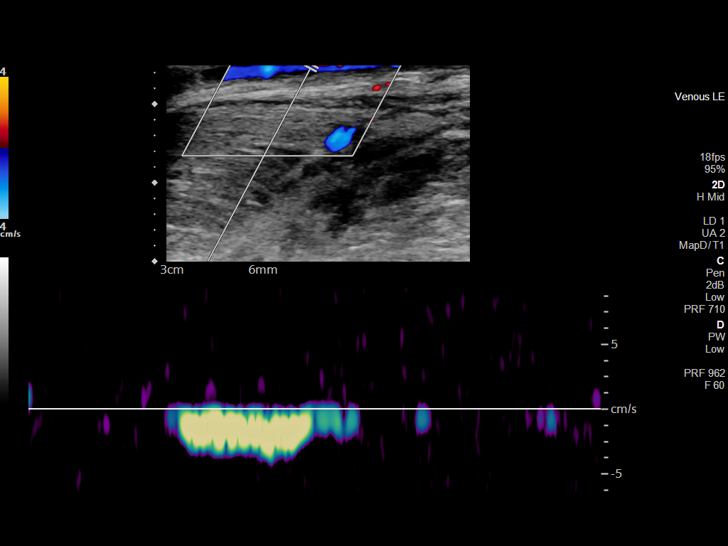
[im 35/35]
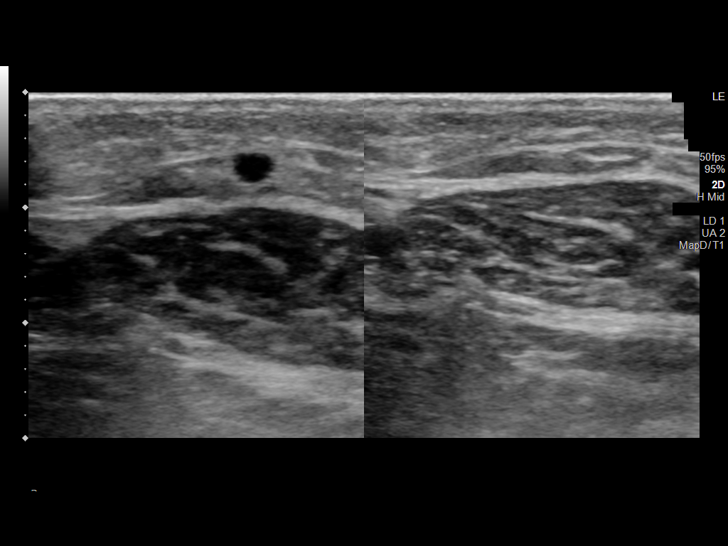

[13 of 24 positions shown; findings below may reference images not displayed]

FINDINGS: Contralateral Common Femoral Vein: Respiratory phasicity is normal
and symmetric with the symptomatic side. No evidence of thrombus.
Normal compressibility.

Common Femoral Vein: No evidence of thrombus. Normal
compressibility, respiratory phasicity and response to augmentation.

Saphenofemoral Junction: No evidence of thrombus. Normal
compressibility and flow on color Doppler imaging.

Profunda Femoral Vein: No evidence of thrombus. Normal
compressibility and flow on color Doppler imaging.

Femoral Vein: No evidence of thrombus. Normal compressibility,
respiratory phasicity and response to augmentation.

Popliteal Vein: No evidence of thrombus. Normal compressibility,
respiratory phasicity and response to augmentation.

Calf Veins: No evidence of thrombus. Normal compressibility and flow
on color Doppler imaging.

Superficial Great Saphenous Vein: No evidence of thrombus. Normal
compressibility.

Venous Reflux:  None.

Other Findings: Diffuse soft tissue/interstitial edema within the
subcutaneous soft tissues of the left calf.
IMPRESSION: 1. No evidence of deep venous thrombosis.
2. Diffuse soft tissue/interstitial edema within the subcutaneous
soft tissues of the left calf.

## 2023-09-04 ENCOUNTER — Telehealth: Admitting: Nurse Practitioner

## 2023-09-04 DIAGNOSIS — J069 Acute upper respiratory infection, unspecified: Secondary | ICD-10-CM | POA: Diagnosis not present

## 2023-09-04 MED ORDER — BENZONATATE 100 MG PO CAPS: 100.0000 mg | ORAL_CAPSULE | Freq: Three times a day (TID) | ORAL | 0 refills | Status: AC | PRN

## 2023-09-04 NOTE — Progress Notes (Signed)
 I have spent 5 minutes in review of e-visit questionnaire, review and updating patient chart, medical decision making and response to patient.   Claiborne Rigg, NP

## 2023-09-04 NOTE — Progress Notes (Signed)
 I recommend you take a COVID test to make sure you don't have COVID  Providers prescribe antibiotics to treat infections caused by bacteria. Antibiotics are very powerful in treating bacterial infections when they are used properly. To maintain their effectiveness, they should be used only when necessary. Overuse of antibiotics has resulted in the development of superbugs that are resistant to treatment!    After careful review of your answers, I would not recommend an antibiotic for your condition.  Antibiotics are not effective against viruses and therefore should not be used to treat them. Common examples of infections caused by viruses include colds and flu   E-Visit for Upper Respiratory Infection   We are sorry you are not feeling well.  Here is how we plan to help!  Based on what you have shared with me, it looks like you may have a viral upper respiratory infection.  Upper respiratory infections are caused by a large number of viruses; however, rhinovirus is the most common cause.   Symptoms vary from person to person, with common symptoms including sore throat, cough, fatigue or lack of energy and feeling of general discomfort.  A low-grade fever of up to 100.4 may present, but is often uncommon.  Symptoms vary however, and are closely related to a person's age or underlying illnesses.  The most common symptoms associated with an upper respiratory infection are nasal discharge or congestion, cough, sneezing, headache and pressure in the ears and face.  These symptoms usually persist for about 3 to 10 days, but can last up to 2 weeks.  It is important to know that upper respiratory infections do not cause serious illness or complications in most cases.    Upper respiratory infections can be transmitted from person to person, with the most common method of transmission being a person's hands.  The virus is able to live on the skin and can infect other persons for up to 2 hours after direct  contact.  Also, these can be transmitted when someone coughs or sneezes; thus, it is important to cover the mouth to reduce this risk.  To keep the spread of the illness at bay, good hand hygiene is very important.  This is an infection that is most likely caused by a virus. There are no specific treatments other than to help you with the symptoms until the infection runs its course.  We are sorry you are not feeling well.  Here is how we plan to help!   For nasal congestion, you may use an oral decongestants such as Mucinex D or if you have glaucoma or high blood pressure use plain Mucinex.  Saline nasal spray or nasal drops can help and can safely be used as often as needed for congestion.  For your congestion, I recommend you continue flonase  nasal spray  If you do not have a history of heart disease, hypertension, diabetes or thyroid  disease, prostate/bladder issues or glaucoma, you may also use Sudafed to treat nasal congestion.  It is highly recommended that you consult with a pharmacist or your primary care physician to ensure this medication is safe for you to take.     If you have a cough, you may use cough suppressants such as Delsym and Robitussin.  If you have glaucoma or high blood pressure, you can also use Coricidin HBP.   For cough I have prescribed for you A prescription cough medication called Tessalon  Perles 100 mg. You may take 1-2 capsules every 8 hours as  needed for cough   If you have a sore or scratchy throat, use a saltwater gargle-  to  teaspoon of salt dissolved in a 4-ounce to 8-ounce glass of warm water.  Gargle the solution for approximately 15-30 seconds and then spit.  It is important not to swallow the solution.  You can also use throat lozenges/cough drops and Chloraseptic spray to help with throat pain or discomfort.  Warm or cold liquids can also be helpful in relieving throat pain.  For headache, pain or general discomfort, you can use Ibuprofen  or Tylenol  as  directed.   Some authorities believe that zinc sprays or the use of Echinacea may shorten the course of your symptoms.   HOME CARE Only take medications as instructed by your medical team. Be sure to drink plenty of fluids. Water is fine as well as fruit juices, sodas and electrolyte beverages. You may want to stay away from caffeine or alcohol. If you are nauseated, try taking small sips of liquids. How do you know if you are getting enough fluid? Your urine should be a pale yellow or almost colorless. Get rest. Taking a steamy shower or using a humidifier may help nasal congestion and ease sore throat pain. You can place a towel over your head and breathe in the steam from hot water coming from a faucet. Using a saline nasal spray works much the same way. Cough drops, hard candies and sore throat lozenges may ease your cough. Avoid close contacts especially the very young and the elderly Cover your mouth if you cough or sneeze Always remember to wash your hands.   GET HELP RIGHT AWAY IF: You develop worsening fever. If your symptoms do not improve within 10 days You develop yellow or green discharge from your nose over 3 days. You have coughing fits You develop a severe head ache or visual changes. You develop shortness of breath, difficulty breathing or start having chest pain Your symptoms persist after you have completed your treatment plan  MAKE SURE YOU  Understand these instructions. Will watch your condition. Will get help right away if you are not doing well or get worse.  Thank you for choosing an e-visit.  Your e-visit answers were reviewed by a board certified advanced clinical practitioner to complete your personal care plan. Depending upon the condition, your plan could have included both over the counter or prescription medications.  Please review your pharmacy choice. Make sure the pharmacy is open so you can pick up prescription now. If there is a problem, you may  contact your provider through [employer] and have the prescription routed to another pharmacy.  Your safety is important to us . If you have drug allergies check your prescription carefully.   For the next 24 hours you can use MyChart to ask questions about today's visit, request a non-urgent call back, or ask for a work or school excuse. You will get an email in the next two days asking about your experience. I hope that your e-visit has been valuable and will speed your recovery.

## 2023-09-22 ENCOUNTER — Other Ambulatory Visit: Payer: Self-pay | Admitting: Family Medicine

## 2023-09-27 ENCOUNTER — Other Ambulatory Visit: Payer: Self-pay | Admitting: Family Medicine

## 2023-11-03 ENCOUNTER — Encounter: Admitting: Family Medicine

## 2023-11-10 ENCOUNTER — Ambulatory Visit (INDEPENDENT_AMBULATORY_CARE_PROVIDER_SITE_OTHER): Admitting: Family Medicine

## 2023-11-10 VITALS — BP 128/70 | HR 76 | Temp 97.8°F | Ht 67.0 in | Wt 192.0 lb

## 2023-11-10 DIAGNOSIS — Z131 Encounter for screening for diabetes mellitus: Secondary | ICD-10-CM | POA: Diagnosis not present

## 2023-11-10 DIAGNOSIS — Z13220 Encounter for screening for lipoid disorders: Secondary | ICD-10-CM | POA: Diagnosis not present

## 2023-11-10 DIAGNOSIS — E291 Testicular hypofunction: Secondary | ICD-10-CM | POA: Diagnosis not present

## 2023-11-10 DIAGNOSIS — M170 Bilateral primary osteoarthritis of knee: Secondary | ICD-10-CM

## 2023-11-10 DIAGNOSIS — Z125 Encounter for screening for malignant neoplasm of prostate: Secondary | ICD-10-CM | POA: Diagnosis not present

## 2023-11-10 DIAGNOSIS — Z23 Encounter for immunization: Secondary | ICD-10-CM | POA: Diagnosis not present

## 2023-11-10 DIAGNOSIS — M1990 Unspecified osteoarthritis, unspecified site: Secondary | ICD-10-CM | POA: Insufficient documentation

## 2023-11-10 DIAGNOSIS — Z0000 Encounter for general adult medical examination without abnormal findings: Secondary | ICD-10-CM

## 2023-11-10 LAB — HEPATIC FUNCTION PANEL
ALT: 21 U/L (ref 0–53)
AST: 19 U/L (ref 0–37)
Albumin: 4.2 g/dL (ref 3.5–5.2)
Alkaline Phosphatase: 70 U/L (ref 39–117)
Bilirubin, Direct: 0.1 mg/dL (ref 0.0–0.3)
Total Bilirubin: 0.7 mg/dL (ref 0.2–1.2)
Total Protein: 6.5 g/dL (ref 6.0–8.3)

## 2023-11-10 LAB — BASIC METABOLIC PANEL WITH GFR
BUN: 11 mg/dL (ref 6–23)
CO2: 26 meq/L (ref 19–32)
Calcium: 8.7 mg/dL (ref 8.4–10.5)
Chloride: 103 meq/L (ref 96–112)
Creatinine, Ser: 1.07 mg/dL (ref 0.40–1.50)
GFR: 74.23 mL/min (ref >60.00)
Glucose, Bld: 97 mg/dL (ref 70–99)
Potassium: 4.1 meq/L (ref 3.5–5.1)
Sodium: 138 meq/L (ref 135–145)

## 2023-11-10 LAB — CBC WITH DIFFERENTIAL/PLATELET
Basophils Absolute: 0.1 K/uL (ref 0.0–0.1)
Basophils Relative: 1 % (ref 0.0–3.0)
Eosinophils Absolute: 0.6 K/uL (ref 0.0–0.7)
Eosinophils Relative: 7.3 % — ABNORMAL HIGH (ref 0.0–5.0)
HCT: 49.1 % (ref 39.0–52.0)
Hemoglobin: 16.2 g/dL (ref 13.0–17.0)
Lymphocytes Relative: 17.4 % (ref 12.0–46.0)
Lymphs Abs: 1.4 K/uL (ref 0.7–4.0)
MCHC: 33 g/dL (ref 30.0–36.0)
MCV: 92.1 fl (ref 78.0–100.0)
Monocytes Absolute: 0.7 K/uL (ref 0.1–1.0)
Monocytes Relative: 8.5 % (ref 3.0–12.0)
Neutro Abs: 5.5 K/uL (ref 1.4–7.7)
Neutrophils Relative %: 65.8 % (ref 43.0–77.0)
Platelets: 312 K/uL (ref 150.0–400.0)
RBC: 5.34 Mil/uL (ref 4.22–5.81)
RDW: 14.4 % (ref 11.5–15.5)
WBC: 8.3 K/uL (ref 4.0–10.5)

## 2023-11-10 LAB — LIPID PANEL
Cholesterol: 133 mg/dL (ref 0–200)
HDL: 34.9 mg/dL — ABNORMAL LOW (ref >39.00)
LDL Cholesterol: 76 mg/dL (ref 0–99)
NonHDL: 98.34
Total CHOL/HDL Ratio: 4
Triglycerides: 110 mg/dL (ref 0.0–149.0)
VLDL: 22 mg/dL (ref 0.0–40.0)

## 2023-11-10 LAB — HEMOGLOBIN A1C: Hgb A1c MFr Bld: 5.7 % (ref 4.6–6.5)

## 2023-11-10 MED ORDER — ALBUTEROL SULFATE (2.5 MG/3ML) 0.083% IN NEBU: 2.5000 mg | INHALATION_SOLUTION | RESPIRATORY_TRACT | 11 refills | Status: AC | PRN

## 2023-11-10 MED ORDER — AMLODIPINE BESYLATE 5 MG PO TABS: 5.0000 mg | ORAL_TABLET | Freq: Every day | ORAL | 3 refills | Status: AC

## 2023-11-10 MED ORDER — TADALAFIL 5 MG PO TABS: ORAL_TABLET | ORAL | 3 refills | Status: AC

## 2023-11-10 MED ORDER — BUPROPION HCL ER (XL) 300 MG PO TB24: 300.0000 mg | ORAL_TABLET | Freq: Every day | ORAL | 3 refills | Status: AC

## 2023-11-10 MED ORDER — LAMOTRIGINE 100 MG PO TABS: 100.0000 mg | ORAL_TABLET | Freq: Two times a day (BID) | ORAL | 3 refills | Status: DC

## 2023-11-10 MED ORDER — LOSARTAN POTASSIUM 100 MG PO TABS: 100.0000 mg | ORAL_TABLET | Freq: Every day | ORAL | 3 refills | Status: AC

## 2023-11-10 MED ORDER — TESTOSTERONE CYPIONATE 200 MG/ML IM SOLN: 200.0000 mg | INTRAMUSCULAR | 5 refills | Status: AC

## 2023-11-10 MED ORDER — FLUTICASONE-SALMETEROL 250-50 MCG/ACT IN AEPB: 1.0000 | INHALATION_SPRAY | Freq: Two times a day (BID) | RESPIRATORY_TRACT | 3 refills | Status: AC

## 2023-11-10 MED ORDER — LEVALBUTEROL TARTRATE 45 MCG/ACT IN AERO: 2.0000 | INHALATION_SPRAY | RESPIRATORY_TRACT | 11 refills | Status: AC | PRN

## 2023-11-10 MED ORDER — MONTELUKAST SODIUM 10 MG PO TABS: ORAL_TABLET | ORAL | 3 refills | Status: AC

## 2023-11-10 MED ORDER — ROSUVASTATIN CALCIUM 10 MG PO TABS: 10.0000 mg | ORAL_TABLET | Freq: Every day | ORAL | 3 refills | Status: AC

## 2023-11-10 MED ORDER — OMEPRAZOLE 40 MG PO CPDR: 40.0000 mg | DELAYED_RELEASE_CAPSULE | Freq: Every day | ORAL | 3 refills | Status: AC

## 2023-11-10 NOTE — Progress Notes (Signed)
 Subjective:    Patient ID: Andrew Swanson, male    DOB: 01-Apr-1961, 63 y.o.   MRN: [id number]  HPI Here for a well exam. He is doing well in general. He has some chronic pain in the knees, but he does not treat this with anything. His asthma is stable. His moods have been stable.    Review of Systems  Constitutional: Negative.   HENT: Negative.    Eyes: Negative.   Respiratory: Negative.    Cardiovascular: Negative.   Gastrointestinal: Negative.   Genitourinary: Negative.   Musculoskeletal:  Positive for arthralgias.  Skin: Negative.   Neurological: Negative.   Psychiatric/Behavioral: Negative.         Objective:   Physical Exam Constitutional:      General: He is not in acute distress.    Appearance: Normal appearance. He is well-developed. He is not diaphoretic.  HENT:     Head: Normocephalic and atraumatic.     Right Ear: External ear normal.     Left Ear: External ear normal.     Nose: Nose normal.     Mouth/Throat:     Pharynx: No oropharyngeal exudate.  Eyes:     General: No scleral icterus.       Right eye: No discharge.        Left eye: No discharge.     Conjunctiva/sclera: Conjunctivae normal.     Pupils: Pupils are equal, round, and reactive to light.  Neck:     Thyroid : No thyromegaly.     Vascular: No JVD.     Trachea: No tracheal deviation.  Cardiovascular:     Rate and Rhythm: Normal rate and regular rhythm.     Pulses: Normal pulses.     Heart sounds: Normal heart sounds. No murmur heard.    No friction rub. No gallop.  Pulmonary:     Effort: Pulmonary effort is normal. No respiratory distress.     Breath sounds: Normal breath sounds. No wheezing or rales.  Chest:     Chest wall: No tenderness.  Abdominal:     General: Bowel sounds are normal. There is no distension.     Palpations: Abdomen is soft. There is no mass.     Tenderness: There is no abdominal tenderness. There is no guarding or rebound.  Genitourinary:    Penis: Normal. No  tenderness.      Testes: Normal.  Musculoskeletal:        General: No tenderness. Normal range of motion.     Cervical back: Neck supple.  Lymphadenopathy:     Cervical: No cervical adenopathy.  Skin:    General: Skin is warm and dry.     Coloration: Skin is not pale.     Findings: No erythema or rash.  Neurological:     General: No focal deficit present.     Mental Status: He is alert and oriented to person, place, and time.     Cranial Nerves: No cranial nerve deficit.     Motor: No abnormal muscle tone.     Coordination: Coordination normal.     Deep Tendon Reflexes: Reflexes are normal and symmetric. Reflexes normal.  Psychiatric:        Mood and Affect: Mood normal.        Behavior: Behavior normal.        Thought Content: Thought content normal.        Judgment: Judgment normal.           Assessment &  Plan:  Well exam. We discussed diet and exercise. Get fasting labs. Set up a colonoscopy.  Garnette Olmsted, MD

## 2023-11-10 NOTE — Addendum Note (Signed)
 Addended by: LADONNA INOCENTE SAILOR on: 11/10/2023 11:32 AM   Modules accepted: Orders

## 2023-11-11 LAB — TESTOSTERONE: Testosterone: 992.8 ng/dL — ABNORMAL HIGH (ref 300.00–890.00)

## 2023-11-11 LAB — TSH: TSH: 2.44 u[IU]/mL (ref 0.35–5.50)

## 2023-11-11 LAB — PSA: PSA: 3.92 ng/mL (ref 0.10–4.00)

## 2023-11-13 ENCOUNTER — Ambulatory Visit: Payer: Self-pay | Admitting: Family Medicine

## 2024-01-20 ENCOUNTER — Telehealth: Admitting: Family Medicine

## 2024-01-20 DIAGNOSIS — J019 Acute sinusitis, unspecified: Secondary | ICD-10-CM

## 2024-01-20 DIAGNOSIS — B9689 Other specified bacterial agents as the cause of diseases classified elsewhere: Secondary | ICD-10-CM | POA: Diagnosis not present

## 2024-01-20 MED ORDER — DOXYCYCLINE HYCLATE 100 MG PO TABS: 100.0000 mg | ORAL_TABLET | Freq: Two times a day (BID) | ORAL | 0 refills | Status: AC

## 2024-01-20 NOTE — Progress Notes (Signed)
 E-Visit for Sinus Problems  We are sorry that you are not feeling well.  Here is how we plan to help!  Based on what you have shared with me it looks like you have sinusitis.  Sinusitis is inflammation and infection in the sinus cavities of the head.  Based on your presentation I believe you most likely have Acute Bacterial Sinusitis.  This is an infection caused by bacteria and is treated with antibiotics. I have prescribed Doxycycline  100mg  by mouth twice a day for 7 days. And I will order Tessalon  for your cough.   You may use an oral decongestant such as Mucinex D or if you have glaucoma or high blood pressure use plain Mucinex. Saline nasal spray help and can safely be used as often as needed for congestion.  If you develop worsening sinus pain, fever or notice severe headache and vision changes, or if symptoms are not better after completion of antibiotic, please schedule an appointment with a health care provider.    Sinus infections are not as easily transmitted as other respiratory infection, however we still recommend that you avoid close contact with loved ones, especially the very young and elderly.  Remember to wash your hands thoroughly throughout the day as this is the number one way to prevent the spread of infection!  Home Care: Only take medications as instructed by your medical team. Complete the entire course of an antibiotic. Do not take these medications with alcohol. A steam or ultrasonic humidifier can help congestion.  You can place a towel over your head and breathe in the steam from hot water coming from a faucet. Avoid close contacts especially the very young and the elderly. Cover your mouth when you cough or sneeze. Always remember to wash your hands.  Get Help Right Away If: You develop worsening fever or sinus pain. You develop a severe head ache or visual changes. Your symptoms persist after you have completed your treatment plan.  Make sure  you Understand these instructions. Will watch your condition. Will get help right away if you are not doing well or get worse.  Your e-visit answers were reviewed by a board certified advanced clinical practitioner to complete your personal care plan.  Depending on the condition, your plan could have included both over the counter or prescription medications.  If there is a problem please reply  once you have received a response from your provider.  Your safety is important to us .  If you have drug allergies check your prescription carefully.    You can use MyChart to ask questions about today's visit, request a non-urgent call back, or ask for a work or school excuse for 24 hours related to this e-Visit. If it has been greater than 24 hours you will need to follow up with your provider, or enter a new e-Visit to address those concerns.  You will get an e-mail in the next two days asking about your experience.  I hope that your e-visit has been valuable and will speed your recovery. Thank you for using e-visits.  I have spent 5 minutes in review of e-visit questionnaire, review and updating patient chart, medical decision making and response to patient.   Chiquita CHRISTELLA Barefoot, NP

## 2024-03-12 ENCOUNTER — Other Ambulatory Visit: Payer: Self-pay | Admitting: Family Medicine
